# Patient Record
Sex: Female | Born: 1950 | Race: Black or African American | Hispanic: No | Smoking: Never smoker
Health system: Southern US, Community
[De-identification: ages and names within clinical notes are randomized; demographics above are authoritative.]

## PROBLEM LIST (undated history)

## (undated) DIAGNOSIS — M1990 Unspecified osteoarthritis, unspecified site: Secondary | ICD-10-CM

## (undated) DIAGNOSIS — R7303 Prediabetes: Secondary | ICD-10-CM

## (undated) DIAGNOSIS — F32A Depression, unspecified: Secondary | ICD-10-CM

## (undated) DIAGNOSIS — G4730 Sleep apnea, unspecified: Secondary | ICD-10-CM

## (undated) DIAGNOSIS — J45909 Unspecified asthma, uncomplicated: Secondary | ICD-10-CM

## (undated) DIAGNOSIS — F329 Major depressive disorder, single episode, unspecified: Secondary | ICD-10-CM

## (undated) DIAGNOSIS — K219 Gastro-esophageal reflux disease without esophagitis: Secondary | ICD-10-CM

## (undated) DIAGNOSIS — J189 Pneumonia, unspecified organism: Secondary | ICD-10-CM

## (undated) DIAGNOSIS — I10 Essential (primary) hypertension: Secondary | ICD-10-CM

## (undated) DIAGNOSIS — Z87442 Personal history of urinary calculi: Secondary | ICD-10-CM

## (undated) DIAGNOSIS — I499 Cardiac arrhythmia, unspecified: Secondary | ICD-10-CM

## (undated) HISTORY — PX: ROTATOR CUFF REPAIR: SHX139

## (undated) HISTORY — PX: BACK SURGERY: SHX140

## (undated) HISTORY — PX: ABDOMINAL HYSTERECTOMY: SHX81

---

## 2014-06-26 ENCOUNTER — Ambulatory Visit: Payer: Self-pay

## 2016-06-15 ENCOUNTER — Emergency Department: Payer: Federal, State, Local not specified - PPO

## 2016-06-15 ENCOUNTER — Emergency Department
Admission: EM | Admit: 2016-06-15 | Discharge: 2016-06-15 | Disposition: A | Discharge status: Home / Self Care | Payer: Federal, State, Local not specified - PPO | Attending: Emergency Medicine | Admitting: Emergency Medicine

## 2016-06-15 ENCOUNTER — Encounter: Payer: Self-pay | Admitting: *Deleted

## 2016-06-15 DIAGNOSIS — Z91040 Latex allergy status: Secondary | ICD-10-CM | POA: Insufficient documentation

## 2016-06-15 DIAGNOSIS — M5442 Lumbago with sciatica, left side: Secondary | ICD-10-CM | POA: Diagnosis not present

## 2016-06-15 DIAGNOSIS — M545 Low back pain: Secondary | ICD-10-CM | POA: Diagnosis present

## 2016-06-15 DIAGNOSIS — M549 Dorsalgia, unspecified: Secondary | ICD-10-CM

## 2016-06-15 DIAGNOSIS — M5432 Sciatica, left side: Secondary | ICD-10-CM

## 2016-06-15 MED ORDER — KETOROLAC TROMETHAMINE 60 MG/2ML IM SOLN
60.0000 mg | Freq: Once | INTRAMUSCULAR | Status: AC
  Administered 2016-06-15: 60 mg via INTRAMUSCULAR
  Filled 2016-06-15: qty 2

## 2016-06-15 MED ORDER — OXYCODONE-ACETAMINOPHEN 5-325 MG PO TABS
1.0000 | ORAL_TABLET | Freq: Once | ORAL | Status: AC
  Administered 2016-06-15: 1 via ORAL
  Filled 2016-06-15: qty 1

## 2016-06-15 MED ORDER — CYCLOBENZAPRINE HCL 10 MG PO TABS
10.0000 mg | ORAL_TABLET | Freq: Once | ORAL | Status: AC
  Administered 2016-06-15: 10 mg via ORAL
  Filled 2016-06-15: qty 1

## 2016-06-15 MED ORDER — PREDNISONE 10 MG PO TABS
10.0000 mg | ORAL_TABLET | Freq: Two times a day (BID) | ORAL | 0 refills | Status: DC
Start: 2016-06-15 — End: 2019-04-13

## 2016-06-15 MED ORDER — HYDROCODONE-ACETAMINOPHEN 5-325 MG PO TABS: 1.0000 | ORAL_TABLET | Freq: Three times a day (TID) | ORAL | 0 refills | Status: DC | PRN

## 2016-06-15 MED ORDER — CYCLOBENZAPRINE HCL 5 MG PO TABS: 5.0000 mg | ORAL_TABLET | Freq: Three times a day (TID) | ORAL | 0 refills | Status: DC | PRN

## 2016-06-15 NOTE — Discharge Instructions (Signed)
Take the prescription meds as directed. Follow-up with your provider for continued symptoms. Return to the ED for symptoms worsening as discussed: leg weakness, numbness, or complete loss of bladder/bowel control.

## 2016-06-15 NOTE — ED Provider Notes (Signed)
[facility] Emergency Department Provider Note ____________________________________________  Time seen: 1559  I have reviewed the triage vital signs and the nursing notes.  HISTORY  Chief Complaint  Back Pain  HPI Andrea Glass is a 65 y.o. female presents to the ED for evaluation of low back pain with left lotion a referral that has been aching for the last week, but increased in intensity over the last 3 days. The patient gives a history of a 3-level lumbar laminectomy in 2013.She denies any interim back pain ongoing symptoms following her surgery. She denies any recent injury, accident, trauma, or fall. She also denies any bladder or bowel incontinence. She does admit to a pain in the posterior left leg that radiates from the buttocks down to the foot. He has not sought care with her primary care provider since the onset of symptoms. She's been taking Tylenol about 3 times a day with limited benefit. She is in the preplanning stages of a total knee replacement on the right, and notes that her increased back pain is causing some increased difficulty with her right knee.  History reviewed. No pertinent past medical history.  There are no active problems to display for this patient.  History reviewed. No pertinent surgical history.  Prior to Admission medications   Medication Sig Start Date End Date Taking? Authorizing Provider  cyclobenzaprine (FLEXERIL) 5 MG tablet Take 1 tablet (5 mg total) by mouth 3 (three) times daily as needed for muscle spasms. 06/15/16   Khy Pitre V Bacon Dillard Pascal, PA-C  HYDROcodone-acetaminophen (NORCO) 5-325 MG tablet Take 1 tablet by mouth every 8 (eight) hours as needed. 06/15/16   Rayn Enderson V Bacon Tayshaun Kroh, PA-C  predniSONE (DELTASONE) 10 MG tablet Take 1 tablet (10 mg total) by mouth 2 (two) times daily with a meal. 06/15/16   Sheniece Ruggles V Bacon Stace Peace, PA-C   Allergies Latex  No family history on file.  Social History Social History   Substance Use Topics  . Smoking status: Never Smoker  . Smokeless tobacco: Never Used  . Alcohol use No   Review of Systems  Constitutional: Negative for fever. Cardiovascular: Negative for chest pain. Respiratory: Negative for shortness of breath. Gastrointestinal: Negative for abdominal pain, vomiting and diarrhea. Genitourinary: Negative for dysuria. Musculoskeletal: Positive for back pain. Skin: Negative for rash. Neurological: Negative for headaches, focal weakness or numbness. ____________________________________________  PHYSICAL EXAM:  VITAL SIGNS: ED Triage Vitals  Enc Vitals Group     BP 06/15/16 1524 120/65     Pulse Rate 06/15/16 1524 70     Resp 06/15/16 1524 20     Temp 06/15/16 1524 97.9 F (36.6 C)     Temp src --      SpO2 06/15/16 1524 98 %     Weight 06/15/16 1525 290 lb (131.5 kg)     Height 06/15/16 1525 5\' 5"  (1.651 m)     Head Circumference --      Peak Flow --      Pain Score 06/15/16 1526 10     Pain Loc --      Pain Edu? --      Excl. in GC? --    Constitutional: Alert and oriented. Well appearing and in no distress. Head: Normocephalic and atraumatic. Cardiovascular: Normal rate, regular rhythm.  Respiratory: Normal respiratory effort. No wheezes/rales/rhonchi. Gastrointestinal: Soft and nontender. No distention. Musculoskeletal: Normal spinal alignment without midline tenderness, spasm, deformity, step-off. Patient with normal transition from supine to sit. She had a negative  seated straight leg raise bilaterally. The pain is localized to the posterior and lateral left buttocks and thigh, primarily. Nontender with normal range of motion in all extremities.  Neurologic:  Cranial nerves II through XII grossly intact. Normal LE DTRs bilaterally. Normal gait without ataxia. Normal speech and language. No gross focal neurologic deficits are appreciated. Skin:  Skin is warm, dry and intact. No rash  noted. ____________________________________________   RADIOLOGY  LS Spine FINDINGS: No fracture. No subluxation. Loss of disc height is seen at L3-4 L4-5. The patient is status post L3-5 laminectomy. Facets are well aligned bilaterally. SI joints are normal.  IMPRESSION: Stable exam.  No acute findings.  I, [name], Charlesetta Ivory, personally viewed and evaluated these images (plain radiographs) as part of my medical decision making, as well as reviewing the written report by the radiologist. ____________________________________________  PROCEDURES  Toradol 60 mg IM Flexeril 10 mg PO Percocet 5-325 mg PO ____________________________________________  INITIAL IMPRESSION / ASSESSMENT AND PLAN / ED COURSE  Patient with low back pain with left low extremity radicular symptoms that appear to be consistent with an sciatica. Her x-ray is stable and essentially unchanged from 2015. She is advised this time to follow-up with her primary care provider and/or her spine surgeon for further evaluation management. She will be discharged with prescription for Flexeril, Vicodin, and prednisone. Return precautions are reviewed.  Clinical Course   ____________________________________________  FINAL CLINICAL IMPRESSION(S) / ED DIAGNOSES  Final diagnoses:  Sciatica of left side  Left-sided low back pain with left-sided sciatica  Acute back pain      [name], PA-C 06/15/16 2339    [name], MD 06/15/16 2344

## 2016-06-15 NOTE — ED Triage Notes (Signed)
Pt to triage via wheelchair.  Pt has low back pain for 3 days.  No known injury.  Pt states pain radiates into left leg.  Denies urinary sx

## 2016-07-19 ENCOUNTER — Other Ambulatory Visit: Payer: Self-pay | Admitting: Physician Assistant

## 2016-07-19 NOTE — Progress Notes (Signed)
Pt is being scheduled for preop appt; please place surgical orders in epic. Thanks.  

## 2016-07-23 ENCOUNTER — Encounter (HOSPITAL_COMMUNITY): Payer: Self-pay

## 2016-07-23 ENCOUNTER — Encounter (HOSPITAL_COMMUNITY)
Admission: RE | Admit: 2016-07-23 | Discharge: 2016-07-23 | Disposition: A | Discharge status: Home / Self Care | Payer: Federal, State, Local not specified - PPO | Source: Ambulatory Visit | Attending: Orthopaedic Surgery | Admitting: Orthopaedic Surgery

## 2016-07-23 ENCOUNTER — Encounter (INDEPENDENT_AMBULATORY_CARE_PROVIDER_SITE_OTHER): Payer: Self-pay

## 2016-07-23 DIAGNOSIS — M1711 Unilateral primary osteoarthritis, right knee: Secondary | ICD-10-CM | POA: Diagnosis not present

## 2016-07-23 DIAGNOSIS — Z01810 Encounter for preprocedural cardiovascular examination: Secondary | ICD-10-CM | POA: Diagnosis present

## 2016-07-23 DIAGNOSIS — Z01812 Encounter for preprocedural laboratory examination: Secondary | ICD-10-CM | POA: Insufficient documentation

## 2016-07-23 HISTORY — DX: Prediabetes: R73.03

## 2016-07-23 HISTORY — DX: Unspecified asthma, uncomplicated: J45.909

## 2016-07-23 HISTORY — DX: Major depressive disorder, single episode, unspecified: F32.9

## 2016-07-23 HISTORY — DX: Depression, unspecified: F32.A

## 2016-07-23 HISTORY — DX: Gastro-esophageal reflux disease without esophagitis: K21.9

## 2016-07-23 HISTORY — DX: Personal history of urinary calculi: Z87.442

## 2016-07-23 HISTORY — DX: Essential (primary) hypertension: I10

## 2016-07-23 HISTORY — DX: Cardiac arrhythmia, unspecified: I49.9

## 2016-07-23 HISTORY — DX: Unspecified osteoarthritis, unspecified site: M19.90

## 2016-07-23 HISTORY — DX: Pneumonia, unspecified organism: J18.9

## 2016-07-23 HISTORY — DX: Sleep apnea, unspecified: G47.30

## 2016-07-23 LAB — SURGICAL PCR SCREEN
MRSA, PCR: NEGATIVE
Staphylococcus aureus: NEGATIVE

## 2016-07-23 NOTE — Patient Instructions (Signed)
Andrea Glass  07/23/2016   Your procedure is scheduled on: Friday 07/30/2016  Report to [facility]  Entrance take [place]  elevators to 3rd floor to  Short Stay Center at   1130  AM.  Call this number if you have problems the morning of surgery [phone]   Remember: ONLY 1 PERSON MAY GO WITH YOU TO SHORT STAY TO GET  READY MORNING OF YOUR SURGERY.   Do not eat food :After Midnight.  May have clear liquids from midnight up until 0730 am then nothing until after surgery!     CLEAR LIQUID DIET   Foods Allowed                                                                     Foods Excluded  Coffee and tea, regular and decaf                             liquids that you cannot  Plain Jell-O in any flavor                                             see through such as: Fruit ices (not with fruit pulp)                                     milk, soups, orange juice  Iced Popsicles                                    All solid food Carbonated beverages, regular and diet                                    Cranberry, grape and apple juices Sports drinks like Gatorade Lightly seasoned clear broth or consume(fat free) Sugar, honey syrup  Sample Menu Breakfast                                Lunch                                     Supper Cranberry juice                    Beef broth                            Chicken broth Jell-O                                     Grape juice  Apple juice Coffee or tea                        Jell-O                                      Popsicle                                                Coffee or tea                        Coffee or tea  _____________________________________________________________________     Take these medicines the morning of surgery with A SIP OF WATER: Zantac, use Ventolin inhaler if needed                                You may not have any metal on your body including  hair pins and              piercings  Do not wear jewelry, make-up, lotions, powders or perfumes, deodorant             Do not wear nail polish.  Do not shave  48 hours prior to surgery.              Men may shave face and neck.   Do not bring valuables to the hospital. [facility] IS NOT             RESPONSIBLE   FOR VALUABLES.  Contacts, dentures or bridgework may not be worn into surgery.  Leave suitcase in the car. After surgery it may be brought to your room.                  Please read over the following fact sheets you were given: _____________________________________________________________________             [facility] - Preparing for Surgery Before surgery, you can play an important role.  Because skin is not sterile, your skin needs to be as free of germs as possible.  You can reduce the number of germs on your skin by washing with CHG (chlorahexidine gluconate) soap before surgery.  CHG is an antiseptic cleaner which kills germs and bonds with the skin to continue killing germs even after washing. Please DO NOT use if you have an allergy to CHG or antibacterial soaps.  If your skin becomes reddened/irritated stop using the CHG and inform your nurse when you arrive at Short Stay. Do not shave (including legs and underarms) for at least 48 hours prior to the first CHG shower.  You may shave your face/neck. Please follow these instructions carefully:  1.  Shower with CHG Soap the night before surgery and the  morning of Surgery.  2.  If you choose to wash your hair, wash your hair first as usual with your  normal  shampoo.  3.  After you shampoo, rinse your hair and body thoroughly to remove the  shampoo.                           4.  Use CHG  as you would any other liquid soap.  You can apply chg directly  to the skin and wash                       Gently with a scrungie or clean washcloth.  5.  Apply the CHG Soap to your body ONLY FROM THE NECK DOWN.   Do not use on face/ open                            Wound or open sores. Avoid contact with eyes, ears mouth and genitals (private parts).                       Wash face,  Genitals (private parts) with your normal soap.             6.  Wash thoroughly, paying special attention to the area where your surgery  will be performed.  7.  Thoroughly rinse your body with warm water from the neck down.  8.  DO NOT shower/wash with your normal soap after using and rinsing off  the CHG Soap.                9.  Pat yourself dry with a clean towel.            10.  Wear clean pajamas.            11.  Place clean sheets on your bed the night of your first shower and do not  sleep with pets. Day of Surgery : Do not apply any lotions/deodorants the morning of surgery.  Please wear clean clothes to the hospital/surgery center.  FAILURE TO FOLLOW THESE INSTRUCTIONS MAY RESULT IN THE CANCELLATION OF YOUR SURGERY PATIENT SIGNATURE_________________________________  NURSE SIGNATURE__________________________________  ________________________________________________________________________   Andrea Glass  An incentive spirometer is a tool that can help keep your lungs clear and active. This tool measures how well you are filling your lungs with each breath. Taking long deep breaths may help reverse or decrease the chance of developing breathing (pulmonary) problems (especially infection) following:  A long period of time when you are unable to move or be active. BEFORE THE PROCEDURE   If the spirometer includes an indicator to show your best effort, your nurse or respiratory therapist will set it to a desired goal.  If possible, sit up straight or lean slightly forward. Try not to slouch.  Hold the incentive spirometer in an upright position. INSTRUCTIONS FOR USE  1. Sit on the edge of your bed if possible, or sit up as far as you can in bed or on a chair. 2. Hold the incentive spirometer in an upright position. 3. Breathe  out normally. 4. Place the mouthpiece in your mouth and seal your lips tightly around it. 5. Breathe in slowly and as deeply as possible, raising the piston or the ball toward the top of the column. 6. Hold your breath for 3-5 seconds or for as long as possible. Allow the piston or ball to fall to the bottom of the column. 7. Remove the mouthpiece from your mouth and breathe out normally. 8. Rest for a few seconds and repeat Steps 1 through 7 at least 10 times every 1-2 hours when you are awake. Take your time and take a few normal breaths between deep breaths. 9. The spirometer may include an indicator to show your best effort.  Use the indicator as a goal to work toward during each repetition. 10. After each set of 10 deep breaths, practice coughing to be sure your lungs are clear. If you have an incision (the cut made at the time of surgery), support your incision when coughing by placing a pillow or rolled up towels firmly against it. Once you are able to get out of bed, walk around indoors and cough well. You may stop using the incentive spirometer when instructed by your caregiver.  RISKS AND COMPLICATIONS  Take your time so you do not get dizzy or light-headed.  If you are in pain, you may need to take or ask for pain medication before doing incentive spirometry. It is harder to take a deep breath if you are having pain. AFTER USE  Rest and breathe slowly and easily.  It can be helpful to keep track of a log of your progress. Your caregiver can provide you with a simple table to help with this. If you are using the spirometer at home, follow these instructions: SEEK MEDICAL CARE IF:   You are having difficultly using the spirometer.  You have trouble using the spirometer as often as instructed.  Your pain medication is not giving enough relief while using the spirometer.  You develop fever of 100.5 F (38.1 C) or higher. SEEK IMMEDIATE MEDICAL CARE IF:   You cough up bloody  sputum that had not been present before.  You develop fever of 102 F (38.9 C) or greater.  You develop worsening pain at or near the incision site. MAKE SURE YOU:   Understand these instructions.  Will watch your condition.  Will get help right away if you are not doing well or get worse. Document Released: 02/14/2007 Document Revised: 12/27/2011 Document Reviewed: 04/17/2007 [facility] Patient Information 2014 [place].   ________________________________________________________________________

## 2016-07-29 MED ORDER — DEXTROSE 5 % IV SOLN
3.0000 g | INTRAVENOUS | Status: AC
  Administered 2016-07-30: 3 g via INTRAVENOUS
  Filled 2016-07-29: qty 3000

## 2016-07-30 ENCOUNTER — Inpatient Hospital Stay (HOSPITAL_COMMUNITY): Payer: Federal, State, Local not specified - PPO | Admitting: Anesthesiology

## 2016-07-30 ENCOUNTER — Inpatient Hospital Stay (HOSPITAL_COMMUNITY)
Admission: RE | Admit: 2016-07-30 | Discharge: 2016-08-04 | DRG: 470 | Disposition: A | Discharge status: Skilled Nursing Facility | Payer: Federal, State, Local not specified - PPO | Source: Ambulatory Visit | Attending: Orthopaedic Surgery | Admitting: Orthopaedic Surgery

## 2016-07-30 ENCOUNTER — Encounter (HOSPITAL_COMMUNITY): Payer: Self-pay | Admitting: *Deleted

## 2016-07-30 ENCOUNTER — Inpatient Hospital Stay (HOSPITAL_COMMUNITY): Payer: Federal, State, Local not specified - PPO

## 2016-07-30 ENCOUNTER — Encounter (HOSPITAL_COMMUNITY)
Admission: RE | Disposition: A | Discharge status: Skilled Nursing Facility | Payer: Self-pay | Source: Ambulatory Visit | Attending: Orthopaedic Surgery

## 2016-07-30 DIAGNOSIS — Z6841 Body Mass Index (BMI) 40.0 and over, adult: Secondary | ICD-10-CM

## 2016-07-30 DIAGNOSIS — E669 Obesity, unspecified: Secondary | ICD-10-CM | POA: Diagnosis present

## 2016-07-30 DIAGNOSIS — I10 Essential (primary) hypertension: Secondary | ICD-10-CM | POA: Diagnosis present

## 2016-07-30 DIAGNOSIS — Z96651 Presence of right artificial knee joint: Secondary | ICD-10-CM

## 2016-07-30 DIAGNOSIS — K219 Gastro-esophageal reflux disease without esophagitis: Secondary | ICD-10-CM | POA: Diagnosis present

## 2016-07-30 DIAGNOSIS — G4730 Sleep apnea, unspecified: Secondary | ICD-10-CM | POA: Diagnosis present

## 2016-07-30 DIAGNOSIS — J45909 Unspecified asthma, uncomplicated: Secondary | ICD-10-CM | POA: Diagnosis present

## 2016-07-30 DIAGNOSIS — M25561 Pain in right knee: Secondary | ICD-10-CM | POA: Diagnosis present

## 2016-07-30 DIAGNOSIS — M21061 Valgus deformity, not elsewhere classified, right knee: Secondary | ICD-10-CM | POA: Diagnosis present

## 2016-07-30 DIAGNOSIS — M1711 Unilateral primary osteoarthritis, right knee: Secondary | ICD-10-CM | POA: Diagnosis present

## 2016-07-30 DIAGNOSIS — R262 Difficulty in walking, not elsewhere classified: Secondary | ICD-10-CM

## 2016-07-30 HISTORY — PX: TOTAL KNEE ARTHROPLASTY: SHX125

## 2016-07-30 SURGERY — ARTHROPLASTY, KNEE, TOTAL
Anesthesia: Spinal | Site: Knee | Laterality: Right

## 2016-07-30 MED ORDER — SERTRALINE HCL 50 MG PO TABS
50.0000 mg | ORAL_TABLET | Freq: Every day | ORAL | Status: DC
Start: 2016-07-31 — End: 2016-08-04
  Administered 2016-07-31 – 2016-08-04 (×5): 50 mg via ORAL
  Filled 2016-07-30 (×5): qty 1

## 2016-07-30 MED ORDER — ALUM & MAG HYDROXIDE-SIMETH 200-200-20 MG/5ML PO SUSP: 30.0000 mL | ORAL | Status: DC | PRN

## 2016-07-30 MED ORDER — PROPOFOL 10 MG/ML IV BOLUS
INTRAVENOUS | Status: DC | PRN
  Administered 2016-07-30: 200 mg via INTRAVENOUS

## 2016-07-30 MED ORDER — ACETAMINOPHEN 650 MG RE SUPP: 650.0000 mg | Freq: Four times a day (QID) | RECTAL | Status: DC | PRN

## 2016-07-30 MED ORDER — SUGAMMADEX SODIUM 500 MG/5ML IV SOLN
INTRAVENOUS | Status: DC | PRN
  Administered 2016-07-30: 260 mg via INTRAVENOUS

## 2016-07-30 MED ORDER — METOCLOPRAMIDE HCL 5 MG PO TABS: 5.0000 mg | ORAL_TABLET | Freq: Three times a day (TID) | ORAL | Status: DC | PRN

## 2016-07-30 MED ORDER — HYDROCHLOROTHIAZIDE 25 MG PO TABS
25.0000 mg | ORAL_TABLET | Freq: Every day | ORAL | Status: DC
  Administered 2016-07-31 – 2016-08-02 (×3): 25 mg via ORAL
  Filled 2016-07-30 (×5): qty 1

## 2016-07-30 MED ORDER — HYDROMORPHONE HCL 1 MG/ML IJ SOLN
INTRAMUSCULAR | Status: DC | PRN
  Administered 2016-07-30 (×2): 1 mg via INTRAVENOUS

## 2016-07-30 MED ORDER — FENTANYL CITRATE (PF) 100 MCG/2ML IJ SOLN
INTRAMUSCULAR | Status: AC
  Filled 2016-07-30: qty 2

## 2016-07-30 MED ORDER — HYDROMORPHONE HCL 2 MG/ML IJ SOLN
INTRAMUSCULAR | Status: AC
  Filled 2016-07-30: qty 1

## 2016-07-30 MED ORDER — PROPOFOL 10 MG/ML IV BOLUS
INTRAVENOUS | Status: AC
  Filled 2016-07-30: qty 20

## 2016-07-30 MED ORDER — METHOCARBAMOL 1000 MG/10ML IJ SOLN
500.0000 mg | Freq: Four times a day (QID) | INTRAVENOUS | Status: DC | PRN
  Administered 2016-07-30: 500 mg via INTRAVENOUS
  Filled 2016-07-30: qty 5
  Filled 2016-07-30: qty 550

## 2016-07-30 MED ORDER — LACTATED RINGERS IV SOLN
INTRAVENOUS | Status: DC
  Administered 2016-07-30: 17:00:00 via INTRAVENOUS
  Administered 2016-07-30: 1000 mL via INTRAVENOUS

## 2016-07-30 MED ORDER — KETOROLAC TROMETHAMINE 15 MG/ML IJ SOLN
7.5000 mg | Freq: Four times a day (QID) | INTRAMUSCULAR | Status: AC
  Administered 2016-07-30 – 2016-07-31 (×4): 7.5 mg via INTRAVENOUS
  Filled 2016-07-30 (×4): qty 1

## 2016-07-30 MED ORDER — SUGAMMADEX SODIUM 500 MG/5ML IV SOLN
INTRAVENOUS | Status: AC
  Filled 2016-07-30: qty 5

## 2016-07-30 MED ORDER — ALBUTEROL SULFATE HFA 108 (90 BASE) MCG/ACT IN AERS: 1.0000 | INHALATION_SPRAY | Freq: Four times a day (QID) | RESPIRATORY_TRACT | Status: DC | PRN

## 2016-07-30 MED ORDER — ALBUTEROL SULFATE (2.5 MG/3ML) 0.083% IN NEBU
2.5000 mg | INHALATION_SOLUTION | Freq: Four times a day (QID) | RESPIRATORY_TRACT | Status: DC | PRN
  Administered 2016-08-02: 2.5 mg via RESPIRATORY_TRACT
  Filled 2016-07-30: qty 3

## 2016-07-30 MED ORDER — DEXAMETHASONE SODIUM PHOSPHATE 10 MG/ML IJ SOLN
INTRAMUSCULAR | Status: AC
  Filled 2016-07-30: qty 1

## 2016-07-30 MED ORDER — METOCLOPRAMIDE HCL 5 MG/ML IJ SOLN
5.0000 mg | Freq: Three times a day (TID) | INTRAMUSCULAR | Status: DC | PRN
  Administered 2016-07-31 – 2016-08-01 (×2): 10 mg via INTRAVENOUS
  Filled 2016-07-30 (×2): qty 2

## 2016-07-30 MED ORDER — 0.9 % SODIUM CHLORIDE (POUR BTL) OPTIME
TOPICAL | Status: DC | PRN
  Administered 2016-07-30: 1000 mL

## 2016-07-30 MED ORDER — FAMOTIDINE 20 MG PO TABS
20.0000 mg | ORAL_TABLET | Freq: Two times a day (BID) | ORAL | Status: DC
  Administered 2016-07-31 – 2016-08-04 (×9): 20 mg via ORAL
  Filled 2016-07-30 (×10): qty 1

## 2016-07-30 MED ORDER — ONDANSETRON HCL 4 MG PO TABS
4.0000 mg | ORAL_TABLET | Freq: Four times a day (QID) | ORAL | Status: DC | PRN
  Administered 2016-08-01: 4 mg via ORAL
  Filled 2016-07-30: qty 1

## 2016-07-30 MED ORDER — DIPHENHYDRAMINE HCL 12.5 MG/5ML PO ELIX: 12.5000 mg | ORAL_SOLUTION | ORAL | Status: DC | PRN

## 2016-07-30 MED ORDER — SUCCINYLCHOLINE CHLORIDE 200 MG/10ML IV SOSY
PREFILLED_SYRINGE | INTRAVENOUS | Status: DC | PRN
  Administered 2016-07-30: 140 mg via INTRAVENOUS

## 2016-07-30 MED ORDER — PHENOL 1.4 % MT LIQD: 1.0000 | OROMUCOSAL | Status: DC | PRN

## 2016-07-30 MED ORDER — LIDOCAINE 2% (20 MG/ML) 5 ML SYRINGE
INTRAMUSCULAR | Status: DC | PRN
  Administered 2016-07-30: 100 mg via INTRAVENOUS

## 2016-07-30 MED ORDER — MENTHOL 3 MG MT LOZG: 1.0000 | LOZENGE | OROMUCOSAL | Status: DC | PRN

## 2016-07-30 MED ORDER — ROCURONIUM BROMIDE 10 MG/ML (PF) SYRINGE
PREFILLED_SYRINGE | INTRAVENOUS | Status: DC | PRN
  Administered 2016-07-30: 5 mg via INTRAVENOUS
  Administered 2016-07-30: 35 mg via INTRAVENOUS
  Administered 2016-07-30 (×2): 10 mg via INTRAVENOUS

## 2016-07-30 MED ORDER — TRANEXAMIC ACID 1000 MG/10ML IV SOLN
1000.0000 mg | INTRAVENOUS | Status: AC
  Administered 2016-07-30: 1000 mg via INTRAVENOUS
  Filled 2016-07-30: qty 1100

## 2016-07-30 MED ORDER — ROCURONIUM BROMIDE 10 MG/ML (PF) SYRINGE
PREFILLED_SYRINGE | INTRAVENOUS | Status: AC
  Filled 2016-07-30: qty 10

## 2016-07-30 MED ORDER — ONDANSETRON HCL 4 MG/2ML IJ SOLN
4.0000 mg | Freq: Four times a day (QID) | INTRAMUSCULAR | Status: DC | PRN
  Administered 2016-07-31 (×2): 4 mg via INTRAVENOUS
  Filled 2016-07-30 (×2): qty 2

## 2016-07-30 MED ORDER — HYDROMORPHONE HCL 1 MG/ML IJ SOLN
1.0000 mg | INTRAMUSCULAR | Status: DC | PRN
  Administered 2016-07-31 – 2016-08-01 (×7): 1 mg via INTRAVENOUS
  Filled 2016-07-30 (×7): qty 1

## 2016-07-30 MED ORDER — SODIUM CHLORIDE 0.9 % IR SOLN
Status: DC | PRN
  Administered 2016-07-30: 2000 mL

## 2016-07-30 MED ORDER — ACETAMINOPHEN 325 MG PO TABS
650.0000 mg | ORAL_TABLET | Freq: Four times a day (QID) | ORAL | Status: DC | PRN
  Administered 2016-07-31 – 2016-08-04 (×7): 650 mg via ORAL
  Filled 2016-07-30 (×7): qty 2

## 2016-07-30 MED ORDER — CEFAZOLIN SODIUM-DEXTROSE 2-4 GM/100ML-% IV SOLN
2.0000 g | Freq: Four times a day (QID) | INTRAVENOUS | Status: AC
  Administered 2016-07-30 – 2016-07-31 (×2): 2 g via INTRAVENOUS
  Filled 2016-07-30 (×2): qty 100

## 2016-07-30 MED ORDER — METHOCARBAMOL 500 MG PO TABS
500.0000 mg | ORAL_TABLET | Freq: Four times a day (QID) | ORAL | Status: DC | PRN
  Administered 2016-07-31 – 2016-08-03 (×7): 500 mg via ORAL
  Filled 2016-07-30 (×7): qty 1

## 2016-07-30 MED ORDER — DOCUSATE SODIUM 100 MG PO CAPS
100.0000 mg | ORAL_CAPSULE | Freq: Two times a day (BID) | ORAL | Status: DC
  Administered 2016-07-31 – 2016-08-04 (×7): 100 mg via ORAL
  Filled 2016-07-30 (×9): qty 1

## 2016-07-30 MED ORDER — CHLORHEXIDINE GLUCONATE 4 % EX LIQD: 60.0000 mL | Freq: Once | CUTANEOUS | Status: DC

## 2016-07-30 MED ORDER — SODIUM CHLORIDE 0.9 % IV SOLN
INTRAVENOUS | Status: DC
  Administered 2016-07-30 – 2016-07-31 (×2): via INTRAVENOUS

## 2016-07-30 MED ORDER — SUCCINYLCHOLINE CHLORIDE 20 MG/ML IJ SOLN
INTRAMUSCULAR | Status: AC
  Filled 2016-07-30: qty 1

## 2016-07-30 MED ORDER — RIVAROXABAN 10 MG PO TABS
10.0000 mg | ORAL_TABLET | Freq: Every day | ORAL | Status: DC
  Administered 2016-07-31 – 2016-08-04 (×5): 10 mg via ORAL
  Filled 2016-07-30 (×5): qty 1

## 2016-07-30 MED ORDER — HYDROMORPHONE HCL 1 MG/ML IJ SOLN
INTRAMUSCULAR | Status: AC
  Filled 2016-07-30: qty 1

## 2016-07-30 MED ORDER — MIDAZOLAM HCL 2 MG/2ML IJ SOLN
INTRAMUSCULAR | Status: AC
  Filled 2016-07-30: qty 2

## 2016-07-30 MED ORDER — ONDANSETRON HCL 4 MG/2ML IJ SOLN
INTRAMUSCULAR | Status: AC
  Filled 2016-07-30: qty 2

## 2016-07-30 MED ORDER — OXYCODONE HCL 5 MG PO TABS
5.0000 mg | ORAL_TABLET | ORAL | Status: DC | PRN
  Administered 2016-07-30 – 2016-07-31 (×3): 5 mg via ORAL
  Administered 2016-07-31: 10 mg via ORAL
  Administered 2016-07-31: 5 mg via ORAL
  Administered 2016-07-31 – 2016-08-04 (×18): 10 mg via ORAL
  Filled 2016-07-30 (×7): qty 2
  Filled 2016-07-30: qty 1
  Filled 2016-07-30 (×2): qty 2
  Filled 2016-07-30 (×2): qty 1
  Filled 2016-07-30 (×11): qty 2
  Filled 2016-07-30: qty 1

## 2016-07-30 MED ORDER — HYDRALAZINE HCL 20 MG/ML IJ SOLN
INTRAMUSCULAR | Status: AC
  Filled 2016-07-30: qty 1

## 2016-07-30 MED ORDER — LABETALOL HCL 5 MG/ML IV SOLN
INTRAVENOUS | Status: AC
  Filled 2016-07-30: qty 4

## 2016-07-30 MED ORDER — HYDROMORPHONE HCL 1 MG/ML IJ SOLN
0.2500 mg | INTRAMUSCULAR | Status: DC | PRN
  Administered 2016-07-30 (×2): 0.5 mg via INTRAVENOUS

## 2016-07-30 MED ORDER — ONDANSETRON HCL 4 MG/2ML IJ SOLN
INTRAMUSCULAR | Status: DC | PRN
  Administered 2016-07-30: 4 mg via INTRAVENOUS

## 2016-07-30 SURGICAL SUPPLY — 51 items
BAG ZIPLOCK 12X15 (MISCELLANEOUS) IMPLANT
BANDAGE ACE 6X5 VEL STRL LF (GAUZE/BANDAGES/DRESSINGS) ×3 IMPLANT
BENZOIN TINCTURE PRP APPL 2/3 (GAUZE/BANDAGES/DRESSINGS) ×3 IMPLANT
BLADE SAG 13.0X1.37X90 (BLADE) IMPLANT
BLADE SAG 18X100X1.27 (BLADE) ×3 IMPLANT
BOWL SMART MIX CTS (DISPOSABLE) ×3 IMPLANT
CAPT KNEE TOTAL 3 ×3 IMPLANT
CEMENT BONE SIMPLEX SPEEDSET (Cement) ×3 IMPLANT
CLOSURE WOUND 1/2 X4 (GAUZE/BANDAGES/DRESSINGS) ×2
CLOTH BEACON ORANGE TIMEOUT ST (SAFETY) ×3 IMPLANT
CUFF TOURN SGL QUICK 34 (TOURNIQUET CUFF) ×2
CUFF TRNQT CYL 34X4X40X1 (TOURNIQUET CUFF) ×1 IMPLANT
DRAPE U-SHAPE 47X51 STRL (DRAPES) ×3 IMPLANT
DRSG AQUACEL AG ADV 3.5X10 (GAUZE/BANDAGES/DRESSINGS) ×3 IMPLANT
DRSG PAD ABDOMINAL 8X10 ST (GAUZE/BANDAGES/DRESSINGS) ×3 IMPLANT
DURAPREP 26ML APPLICATOR (WOUND CARE) ×3 IMPLANT
ELECT REM PT RETURN 9FT ADLT (ELECTROSURGICAL) ×3
ELECTRODE REM PT RTRN 9FT ADLT (ELECTROSURGICAL) ×1 IMPLANT
GAUZE SPONGE 4X4 12PLY STRL (GAUZE/BANDAGES/DRESSINGS) ×3 IMPLANT
GAUZE XEROFORM 1X8 LF (GAUZE/BANDAGES/DRESSINGS) IMPLANT
GLOVE BIO SURGEON STRL SZ7.5 (GLOVE) IMPLANT
GLOVE BIOGEL PI IND STRL 8 (GLOVE) ×1 IMPLANT
GLOVE BIOGEL PI INDICATOR 8 (GLOVE) ×2
GLOVE ECLIPSE 8.0 STRL XLNG CF (GLOVE) IMPLANT
GOWN STRL REUS W/TWL XL LVL3 (GOWN DISPOSABLE) ×6 IMPLANT
HANDPIECE INTERPULSE COAX TIP (DISPOSABLE) ×2
IMMOBILIZER KNEE 20 (SOFTGOODS) ×3
IMMOBILIZER KNEE 20 THIGH 36 (SOFTGOODS) ×1 IMPLANT
IMMOBILIZER KNEE 22 (SOFTGOODS) ×3 IMPLANT
NS IRRIG 1000ML POUR BTL (IV SOLUTION) ×3 IMPLANT
PACK TOTAL KNEE CUSTOM (KITS) ×3 IMPLANT
PADDING CAST COTTON 6X4 STRL (CAST SUPPLIES) ×6 IMPLANT
POSITIONER SURGICAL ARM (MISCELLANEOUS) ×3 IMPLANT
SET HNDPC FAN SPRY TIP SCT (DISPOSABLE) ×1 IMPLANT
SET PAD KNEE POSITIONER (MISCELLANEOUS) ×3 IMPLANT
STAPLER VISISTAT 35W (STAPLE) IMPLANT
STRIP CLOSURE SKIN 1/2X4 (GAUZE/BANDAGES/DRESSINGS) ×4 IMPLANT
SUCTION FRAZIER HANDLE 12FR (TUBING) ×2
SUCTION TUBE FRAZIER 12FR DISP (TUBING) ×1 IMPLANT
SUT MNCRL AB 4-0 PS2 18 (SUTURE) ×3 IMPLANT
SUT VIC AB 0 CT1 27 (SUTURE) ×2
SUT VIC AB 0 CT1 27XBRD ANTBC (SUTURE) ×1 IMPLANT
SUT VIC AB 1 CT1 27 (SUTURE) ×4
SUT VIC AB 1 CT1 27XBRD ANTBC (SUTURE) ×2 IMPLANT
SUT VIC AB 2-0 CT1 27 (SUTURE) ×4
SUT VIC AB 2-0 CT1 TAPERPNT 27 (SUTURE) ×2 IMPLANT
TRAY FOLEY BAG SILVER LF 16FR (SET/KITS/TRAYS/PACK) ×3 IMPLANT
TRAY FOLEY W/METER SILVER 16FR (SET/KITS/TRAYS/PACK) IMPLANT
WATER STERILE IRR 1500ML POUR (IV SOLUTION) ×3 IMPLANT
WRAP KNEE MAXI GEL POST OP (GAUZE/BANDAGES/DRESSINGS) ×3 IMPLANT
YANKAUER SUCT BULB TIP 10FT TU (MISCELLANEOUS) ×3 IMPLANT

## 2016-07-30 NOTE — H&P (Signed)
TOTAL KNEE ADMISSION H&P  Patient is being admitted for right total knee arthroplasty.  Subjective:  Chief Complaint:right knee pain.  HPI: Andrea Glass, 65 y.o. female, has a history of pain and functional disability in the right knee due to arthritis and has failed non-surgical conservative treatments for greater than 12 weeks to includeNSAID's and/or analgesics, corticosteriod injections, flexibility and strengthening excercises, use of assistive devices, weight reduction as appropriate and activity modification.  Onset of symptoms was gradual, starting 2 years ago with gradually worsening course since that time. The patient noted prior procedures on the knee to include  arthroscopy on the right knee(s).  Patient currently rates pain in the right knee(s) at 10 out of 10 with activity. Patient has night pain, worsening of pain with activity and weight bearing, pain that interferes with activities of daily living, pain with passive range of motion, crepitus and joint swelling.  Patient has evidence of subchondral sclerosis, periarticular osteophytes and joint space narrowing by imaging studies. There is no active infection.  Patient Active Problem List   Diagnosis Date Noted  . Osteoarthritis of right knee 07/30/2016   Past Medical History:  Diagnosis Date  . Arthritis   . Asthma   . Depression    when mother passed away  . Dysrhythmia   . GERD (gastroesophageal reflux disease)   . History of kidney stones   . Hypertension   . Pneumonia   . Pre-diabetes   . Sleep apnea    does not use CPAP    Past Surgical History:  Procedure Laterality Date  . ABDOMINAL HYSTERECTOMY    . BACK SURGERY     cervical and lumbar  . ROTATOR CUFF REPAIR     right    No prescriptions prior to admission.   Allergies  Allergen Reactions  . Latex Hives, Shortness Of Breath and Rash    Social History  Substance Use Topics  . Smoking status: Never Smoker  . Smokeless tobacco: Never Used  .  Alcohol use No    No family history on file.   Review of Systems  Musculoskeletal: Positive for joint pain.  All other systems reviewed and are negative.   Objective:  Physical Exam  Constitutional: She is oriented to person, place, and time. She appears well-developed and well-nourished.  HENT:  Head: Normocephalic and atraumatic.  Eyes: EOM are normal. Pupils are equal, round, and reactive to light.  Neck: Normal range of motion. Neck supple.  Cardiovascular: Normal rate and regular rhythm.   Respiratory: Effort normal and breath sounds normal.  GI: Soft. Bowel sounds are normal.  Musculoskeletal:       Right knee: She exhibits decreased range of motion, swelling, effusion and abnormal alignment. Tenderness found. Medial joint line and lateral joint line tenderness noted.  Neurological: She is alert and oriented to person, place, and time.  Skin: Skin is warm and dry.  Psychiatric: She has a normal mood and affect.    Vital signs in last 24 hours:    Labs:   Estimated body mass index is 47.26 kg/m as calculated from the following:   Height as of 07/23/16: 5\' 5"  (1.651 m).   Weight as of 07/23/16: 128.8 kg (284 lb).   Imaging Review Plain radiographs demonstrate severe degenerative joint disease of the right knee(s). The overall alignment ismild valgus. The bone quality appears to be good for age and reported activity level.  Assessment/Plan:  End stage arthritis, right knee   The patient history, physical  examination, clinical judgment of the provider and imaging studies are consistent with end stage degenerative joint disease of the right knee(s) and total knee arthroplasty is deemed medically necessary. The treatment options including medical management, injection therapy arthroscopy and arthroplasty were discussed at length. The risks and benefits of total knee arthroplasty were presented and reviewed. The risks due to aseptic loosening, infection, stiffness, patella  tracking problems, thromboembolic complications and other imponderables were discussed. The patient acknowledged the explanation, agreed to proceed with the plan and consent was signed. Patient is being admitted for inpatient treatment for surgery, pain control, PT, OT, prophylactic antibiotics, VTE prophylaxis, progressive ambulation and ADL's and discharge planning. The patient is planning to be discharged home with home health services

## 2016-07-30 NOTE — Anesthesia Preprocedure Evaluation (Addendum)
Anesthesia Evaluation  Patient identified by MRN, date of birth, ID band Patient awake    Reviewed: Allergy & Precautions, NPO status   Airway Mallampati: II  TM Distance: >3 FB     Dental   Pulmonary asthma , sleep apnea , pneumonia,    breath sounds clear to auscultation       Cardiovascular hypertension, + dysrhythmias  Rhythm:Regular Rate:Normal     Neuro/Psych    GI/Hepatic Neg liver ROS, GERD  ,  Endo/Other  negative endocrine ROS  Renal/GU negative Renal ROS     Musculoskeletal   Abdominal   Peds  Hematology   Anesthesia Other Findings   Reproductive/Obstetrics                            Anesthesia Physical Anesthesia Plan  ASA: III  Anesthesia Plan: Spinal   Post-op Pain Management:    Induction: Intravenous  Airway Management Planned: Simple Face Mask  Additional Equipment:   Intra-op Plan:   Post-operative Plan:   Informed Consent:   Dental advisory given  Plan Discussed with: Anesthesiologist and CRNA  Anesthesia Plan Comments:       Anesthesia Quick Evaluation

## 2016-07-30 NOTE — Anesthesia Procedure Notes (Signed)
Procedure Name: Intubation Date/Time: 07/30/2016 3:19 PM Performed by: Enriqueta ShutterWILLIFORD, Wasim Hurlbut D Pre-anesthesia Checklist: Patient identified, Emergency Drugs available, Suction available and Patient being monitored Patient Re-evaluated:Patient Re-evaluated prior to inductionOxygen Delivery Method: Circle system utilized Preoxygenation: Pre-oxygenation with 100% oxygen Intubation Type: IV induction Ventilation: Mask ventilation without difficulty Laryngoscope Size: Mac and 3 Tube type: Oral Tube size: 7.5 mm Number of attempts: 1 Airway Equipment and Method: Stylet and Oral airway Placement Confirmation: ETT inserted through vocal cords under direct vision,  positive ETCO2 and breath sounds checked- equal and bilateral Secured at: 21 cm Tube secured with: Tape Dental Injury: Teeth and Oropharynx as per pre-operative assessment

## 2016-07-30 NOTE — Transfer of Care (Signed)
Immediate Anesthesia Transfer of Care Note  Patient: Andrea Glass  Procedure(s) Performed: Procedure(s): RIGHT TOTAL KNEE ARTHROPLASTY (Right)  Patient Location: PACU  Anesthesia Type:General  Level of Consciousness:  sedated, patient cooperative and responds to stimulation  Airway & Oxygen Therapy:Patient Spontanous Breathing and Patient connected to face mask oxgen  Post-op Assessment:  Report given to PACU RN and Post -op Vital signs reviewed and stable  Post vital signs:  Reviewed and stable  Last Vitals:  Vitals:   07/30/16 1219 07/30/16 1718  BP: 126/70 (!) 153/79  Pulse: 66 71  Resp: 18 (!) 8  Temp: 36.6 C     Complications: No apparent anesthesia complications

## 2016-07-30 NOTE — Brief Op Note (Signed)
07/30/2016  5:09 PM  PATIENT:  Andrea Glass  65 y.o. female  PRE-OPERATIVE DIAGNOSIS:  Osteoarthritis right knee  POST-OPERATIVE DIAGNOSIS:  Osteoarthritis right knee  PROCEDURE:  Procedure(s): RIGHT TOTAL KNEE ARTHROPLASTY (Right)  SURGEON:  Surgeon(s) and Role:    * Kathryne Hitchhristopher Y Rickard Kennerly, MD - Primary    * Naiping Donnelly StagerM Xu, MD - Assisting  ANESTHESIA:   general  EBL:  Total I/O In: 1000 [I.V.:1000] Out: 400 [Urine:300; Blood:100]  COUNTS:  YES  TOURNIQUET:   Total Tourniquet Time Documented: Thigh (Right) - 54 minutes Total: Thigh (Right) - 54 minutes   DICTATION: .Other Dictation: Dictation Number [id number]  PLAN OF CARE: Admit to inpatient   PATIENT DISPOSITION:  PACU - hemodynamically stable.   Delay start of Pharmacological VTE agent (>24hrs) due to surgical blood loss or risk of bleeding: no

## 2016-07-30 NOTE — Progress Notes (Signed)
Called Dr Randa EvensEdwards of anesthesia: Patient got off elevator to Short Stay drinking sprite. Surgery is scheduled for 1410 today. Dr Randa EvensEdwards states she will review patient's chart to assess any other comorbidities that would postpone surgery today.

## 2016-07-31 LAB — CBC
HEMATOCRIT: 34 % — AB (ref 36.0–46.0)
HEMOGLOBIN: 10.8 g/dL — AB (ref 12.0–15.0)
MCH: 23.7 pg — AB (ref 26.0–34.0)
MCHC: 31.8 g/dL (ref 30.0–36.0)
MCV: 74.7 fL — AB (ref 78.0–100.0)
Platelets: 211 10*3/uL (ref 150–400)
RBC: 4.55 MIL/uL (ref 3.87–5.11)
RDW: 16.3 % — ABNORMAL HIGH (ref 11.5–15.5)
WBC: 10.6 10*3/uL — ABNORMAL HIGH (ref 4.0–10.5)

## 2016-07-31 LAB — BASIC METABOLIC PANEL
Anion gap: 10 (ref 5–15)
BUN: 18 mg/dL (ref 6–20)
CHLORIDE: 100 mmol/L — AB (ref 101–111)
CO2: 23 mmol/L (ref 22–32)
CREATININE: 1.26 mg/dL — AB (ref 0.44–1.00)
Calcium: 9.1 mg/dL (ref 8.9–10.3)
GFR calc non Af Amer: 45 mL/min — ABNORMAL LOW (ref >60)
GFR, EST AFRICAN AMERICAN: 52 mL/min — AB (ref >60)
Glucose, Bld: 171 mg/dL — ABNORMAL HIGH (ref 65–99)
Potassium: 3.7 mmol/L (ref 3.5–5.1)
Sodium: 133 mmol/L — ABNORMAL LOW (ref 135–145)

## 2016-07-31 NOTE — Progress Notes (Signed)
PT Cancellation Note  Patient Details Name: Earnestine Mealingrene S Wenk MRN: [id number] DOB: [birth year]   Cancelled Treatment:    Reason Eval/Treat Not Completed: Pt reports vomiting this afternoon.  Pt was agreeable to getting into [facility] this afternoon-made ortho tech aware. Will return in am to resume OOB with PT.   Rebeca AlertJannie Larenda Reedy, MPT Pager: [phone]

## 2016-07-31 NOTE — Progress Notes (Signed)
Subjective: 1 Day Post-Op Procedure(s) (LRB): RIGHT TOTAL KNEE ARTHROPLASTY (Right) Patient reports pain as moderate.    Objective: Vital signs in last 24 hours: Temp:  [97.5 F (36.4 C)-98.5 F (36.9 C)] 98.5 F (36.9 C) (10/14 0506) Pulse Rate:  [66-81] 74 (10/14 0506) Resp:  [8-20] 18 (10/14 0506) BP: (118-166)/(61-81) 118/61 (10/14 0506) SpO2:  [96 %-100 %] 99 % (10/14 0506) Weight:  [128.8 kg (284 lb)] 128.8 kg (284 lb) (10/13 1219)  Intake/Output from previous day: 10/13 0701 - 10/14 0700 In: 2648 [P.O.:1248; I.V.:1400] Out: 825 [Urine:725; Blood:100] Intake/Output this shift: No intake/output data recorded.   Recent Labs  07/31/16 0510  HGB 10.8*    Recent Labs  07/31/16 0510  WBC 10.6*  RBC 4.55  HCT 34.0*  PLT 211    Recent Labs  07/31/16 0510  NA 133*  K 3.7  CL 100*  CO2 23  BUN 18  CREATININE 1.26*  GLUCOSE 171*  CALCIUM 9.1   No results for input(s): LABPT, INR in the last 72 hours.  Sensation intact distally Intact pulses distally Dorsiflexion/Plantar flexion intact Incision: dressing C/D/I Compartment soft  Assessment/Plan: 1 Day Post-Op Procedure(s) (LRB): RIGHT TOTAL KNEE ARTHROPLASTY (Right) Up with therapy Discharge to SNF Monday - needs short-term SNF due to minimal support at home  Andrea HitchChristopher Y Glass [place] 07/31/2016, 7:42 AM

## 2016-07-31 NOTE — Op Note (Signed)
[name], Glass NO.:  [id number]  MEDICAL RECORD NO.:  [id number]  LOCATION:  1607                         FACILITY:  [facility]  PHYSICIAN:  Vanita Panda. Magnus Ivan, M.D.DATE OF BIRTH:  1951/08/15  DATE OF PROCEDURE:  07/30/2016 DATE OF DISCHARGE:                              OPERATIVE REPORT   PREOPERATIVE DIAGNOSIS:  Primary osteoarthritis and degenerative joint disease, right knee.  POSTOPERATIVE DIAGNOSIS:  Primary osteoarthritis and degenerative joint disease, right knee.  PROCEDURE PERFORMED:  Right total knee arthroplasty.  IMPLANTS:  Stryker Triathlon knee with size 3 femur, size 4 tibial tray, 11-mm fix-bearing polyethylene insert, size 35 patellar button.  SURGEON:  Vanita Panda. Magnus Ivan, M.D.  ASSISTANT:  Glee Arvin, M.D.  ANTIBIOTICS:  2 g of IV Ancef.  TOURNIQUET TIME:  Under 2 hours.  BLOOD LOSS:  About 100 mL.  COMPLICATIONS:  None.  INDICATIONS:  Andrea Glass is a very pleasant 65 year old female, well known to me.  She has some mild valgus deformity of her right knee and obvious tricompartmental arthritis.  We have seen her for long period of time and tried intra-articular steroid injection, supplement injections, weight loss, activity modification, anti-inflammatories, and arthroscopic intervention of that knee.  Her pain is daily and it is detrimentally affected her activities of daily living, her mobility and her quality of life to the point that she does wish for a total knee arthroplasty.  She understands the risk of acute blood loss anemia, nerve and vessel injury, fracture, infection, and DVT.  She understands our goals are decreased pain, improved mobility, and overall improved quality of life.  PROCEDURE DESCRIPTION:  After informed consent was obtained, appropriate right knee was marked.  She was brought to the operating room, placed supine on the operative table.  She was then sat up and attempted spinal anesthesia  but due to her obesity and previous back surgery there had unsuccessful attempt for spinal anesthesia.  She was then laid back in a supine position.  General anesthesia was then obtained and a Foley catheter was then placed.  A nonsterile tourniquet was placed around her upper right thigh.  Her right leg was then prepped and draped from the thigh down the ankle with DuraPrep and sterile drapes and a sterile stockinette.  Time-out was called.  She was identified as correct patient and correct right knee.  I then used Esmarch to wrap out the leg and tourniquet was inflated to 300 mm of pressure.  I then made a direct midline incision over the patella and carried this proximally and distally.  I dissected down the knee joint and carried out a medial parapatellar arthrotomy.  We found a moderate joint effusion and significant periarticular osteophytes throughout the knee and significant cartilage loss throughout the knee especially in the lateral compartment I performed a lateral release laterally and removed remnants of ACL, PCL, medial and lateral meniscus.  With the knee in a flexed position, we put the extramedullary cutting guide for the tibia based off the tibial spine correcting for neutral slope, and varus and valgus. We did choose this for taking 9 mm off the high side.  We made this cut without difficulty.  We then went to the femur and used the intramedullary guide for the femur and based our distal femoral cut an 8 mm cut at 5 degrees externally rotated.  We made this cut without difficulty and cleaned debris from the knee.  We brought the knee back down in extension with a 9-mm extension block and I felt like we had full extension.  We then went back to the femur and put our femoral sizing guide based off the epicondylar axis.  We then chose a size 3 femur.  We put our 4-in-1 cutting block for a size 3 femur and made our anterior and posterior cuts followed by our chamfer cuts.   We then made our femoral box cut.  Attention was then turned back to the tibia.  We set our tibial rotation off the tibial tubercle and the femur and then chose a size 4 tibia we made our keel punch off this with a 4 trial tibia and the 3 trial femur.  We trialed a 9 mm and 11 mm polyethylene insert and I was pleased with 11 mm insert.  We then cut our patella and drilled 3 holes for placing a size 35 patellar button.  With all trials inserts in place, I was pleased with stability and range of motion.  We then removed all trial instrumentation and irrigated the knee with normal saline solution using pulsatile lavage.  We mixed our cement and cemented the real Stryker Triathlon tibial tray size 4 followed by the real size 3 femur.  We placed the real 11 mm fix-bearing polyethylene insert and cemented the patellar button.  Once the cement had hardened, we removed cement debris from the knee and we let the tourniquet down. Hemostasis was obtained with electrocautery.  We then closed the arthrotomy with interrupted #1 Vicryl suture followed by 0 Vicryl deep tissue, 2-0 Vicryl in the subcutaneous tissue, 4-0 Monocryl subcuticular stitch and Steri-Strips on the skin.  Well-padded sterile dressing was applied.  She was awakened extubated taken to the recovery room in stable condition.  All final counts were correct.  There were no complications noted.     Vanita Pandahristopher Y. Magnus IvanBlackman, M.D.     CYB/MEDQ  D:  07/30/2016  T:  07/31/2016  Job:  [id number]

## 2016-07-31 NOTE — Evaluation (Signed)
Physical Therapy Evaluation Patient Details Name: Andrea Glass MRN: [id number] DOB: 06-04-1951 Today's Date: 07/31/2016   History of Present Illness  65 yo female s/p R TKA 07/30/16. Hx of obesity  Clinical Impression  On eval, pt required Mod assist +2 for mobility. She was able to walk ~7 feet with a RW. Pain rated 9/10 with activity. Session limited by nausea, dizziness, and pain. Will follow and progress activity as tolerated. Recommend ST rehab at SNF.     Follow Up Recommendations SNF    Equipment Recommendations   (TBD at next venue)    Recommendations for Other Services       Precautions / Restrictions Precautions Precautions: Fall;Knee Required Braces or Orthoses: Knee Immobilizer - Right Knee Immobilizer - Right: Discontinue once straight leg raise with < 10 degree lag Restrictions Weight Bearing Restrictions: No RLE Weight Bearing: Weight bearing as tolerated      Mobility  Bed Mobility Overal bed mobility: Needs Assistance Bed Mobility: Supine to Sit     Supine to sit: HOB elevated;Min assist     General bed mobility comments: assist for R LE. Increased time. Reliance on bedrail.   Transfers Overall transfer level: Needs assistance Equipment used: Rolling walker (2 wheeled) Transfers: Sit to/from Stand Sit to Stand: Mod assist;+2 physical assistance;+2 safety/equipment;From elevated surface         General transfer comment: Assist to rise, stabilize, control descent. VCs safety, technqiue, hand/LE placement. Pt unable to stand with 1 hand on bed so allowed pt to put both hands on walker..  Ambulation/Gait Ambulation/Gait assistance: Min assist;+2 safety/equipment;+2 physical assistance Ambulation Distance (Feet): 7 Feet Assistive device: Rolling walker (2 wheeled) Gait Pattern/deviations: Step-to pattern;Antalgic     General Gait Details: Assist to stabilize pt. Followed closely with recliner. Distance limited by dizziness, nausea, pain, and  fatigue.   Stairs            Wheelchair Mobility    Modified Rankin (Stroke Patients Only)       Balance Overall balance assessment: Needs assistance         Standing balance support: Bilateral upper extremity supported Standing balance-Leahy Scale: Poor                               Pertinent Vitals/Pain Pain Assessment: 0-10 Pain Score: 9  Pain Location: R knee Pain Descriptors / Indicators: Sore;Aching Pain Intervention(s): Limited activity within patient's tolerance;Repositioned;Ice applied    Home Living Family/patient expects to be discharged to:: Skilled nursing facility               Home Equipment: Gilmer Mor - single point      Prior Function                 Hand Dominance        Extremity/Trunk Assessment   Upper Extremity Assessment: Defer to OT evaluation           Lower Extremity Assessment: RLE deficits/detail RLE Deficits / Details: hip flex 2/5.     Cervical / Trunk Assessment: Normal  Communication   Communication: No difficulties  Cognition Arousal/Alertness: Awake/alert Behavior During Therapy: WFL for tasks assessed/performed Overall Cognitive Status: Within Functional Limits for tasks assessed                      General Comments      Exercises     Assessment/Plan    PT Assessment Patient  needs continued PT services  PT Problem List Decreased strength;Decreased mobility;Decreased range of motion;Decreased activity tolerance;Decreased balance;Pain;Decreased knowledge of use of DME          PT Treatment Interventions DME instruction;Gait training;Therapeutic activities;Therapeutic exercise;Balance training;Functional mobility training;Patient/family education    PT Goals (Current goals can be found in the Care Plan section)  Acute Rehab PT Goals Patient Stated Goal: less pain.  PT Goal Formulation: With patient Time For Goal Achievement: 08/14/16 Potential to Achieve Goals: Good     Frequency 7X/week   Barriers to discharge        Co-evaluation               End of Session Equipment Utilized During Treatment: Right knee immobilizer Activity Tolerance: Patient limited by fatigue;Patient limited by pain Patient left: in chair;with call bell/phone within reach           Time: [id number] PT Time Calculation (min) (ACUTE ONLY): 17 min   Charges:   PT Evaluation $PT Eval Low Complexity: 1 Procedure     PT G Codes:        Rebeca AlertJannie Azayla Polo, MPT Pager: [phone]

## 2016-08-01 MED ORDER — OXYCODONE-ACETAMINOPHEN 5-325 MG PO TABS: 1.0000 | ORAL_TABLET | ORAL | 0 refills | Status: DC | PRN

## 2016-08-01 MED ORDER — CYCLOBENZAPRINE HCL 5 MG PO TABS: 10.0000 mg | ORAL_TABLET | Freq: Three times a day (TID) | ORAL | 0 refills | Status: DC | PRN

## 2016-08-01 MED ORDER — RIVAROXABAN 10 MG PO TABS: 10.0000 mg | ORAL_TABLET | Freq: Every day | ORAL | 0 refills | Status: AC

## 2016-08-01 NOTE — [place] FL2 (Signed)
[place] MEDICAID FL2 LEVEL OF CARE SCREENING TOOL     IDENTIFICATION  Patient Name: Andrea Glass Birthdate: May 02, 1951 Sex: female Admission Date (Current Location): 07/30/2016  [facility] and [place] Number:  Producer, television/film/video and Address:  [place], [address] [place], [place] [address]      Provider Number: [id number]  Attending Physician Name and Address:  Kathryne Hitch, *  Relative Name and Phone Number:       Current Level of Care: Hospital Recommended Level of Care: Skilled Nursing Facility Prior Approval Number:    Date Approved/Denied:   PASRR Number: [id number]  Discharge Plan: SNF    Current Diagnoses: Patient Active Problem List   Diagnosis Date Noted  . Osteoarthritis of right knee 07/30/2016  . Status post total right knee replacement 07/30/2016    Orientation RESPIRATION BLADDER Height & Weight     Self, Time, Situation, Place  Normal Continent Weight: 284 lb (128.8 kg) (as per PST on 07/23/16) Height:  5\' 5"  (165.1 cm)  BEHAVIORAL SYMPTOMS/MOOD NEUROLOGICAL BOWEL NUTRITION STATUS      Continent Diet (Regular Diet)  AMBULATORY STATUS COMMUNICATION OF NEEDS Skin   Limited Assist Verbally Normal                       Personal Care Assistance Level of Assistance  Bathing, Feeding, Dressing Bathing Assistance: Limited assistance Feeding assistance: Independent Dressing Assistance: Limited assistance     Functional Limitations Info  Sight, Hearing, Speech Sight Info: Adequate Hearing Info: Adequate Speech Info: Adequate    SPECIAL CARE FACTORS FREQUENCY  PT (By licensed PT), OT (By licensed OT)     PT Frequency: 5 OT Frequency: 5            Contractures Contractures Info: Not present    Additional Factors Info  Code Status, Allergies Code Status Info: Full Code Allergies Info: Latex           Current Medications (08/01/2016):  This is the current hospital active medication  list Current Facility-Administered Medications  Medication Dose Route Frequency Provider Last Rate Last Dose  . 0.9 %  sodium chloride infusion   Intravenous Continuous Kathryne Hitch, MD   Stopped at 08/01/16 0700  . acetaminophen (TYLENOL) tablet 650 mg  650 mg Oral Q6H PRN Kathryne Hitch, MD   650 mg at 07/31/16 1826   Or  . acetaminophen (TYLENOL) suppository 650 mg  650 mg Rectal Q6H PRN Kathryne Hitch, MD      . albuterol (PROVENTIL) (2.5 MG/3ML) 0.083% nebulizer solution 2.5 mg  2.5 mg Nebulization Q6H PRN Kathryne Hitch, MD      . alum & mag hydroxide-simeth (MAALOX/MYLANTA) 200-200-20 MG/5ML suspension 30 mL  30 mL Oral Q4H PRN Kathryne Hitch, MD      . diphenhydrAMINE (BENADRYL) 12.5 MG/5ML elixir 12.5-25 mg  12.5-25 mg Oral Q4H PRN Kathryne Hitch, MD      . docusate sodium (COLACE) capsule 100 mg  100 mg Oral BID Kathryne Hitch, MD   100 mg at 08/01/16 1002  . famotidine (PEPCID) tablet 20 mg  20 mg Oral BID Kathryne Hitch, MD   20 mg at 08/01/16 1002  . hydrochlorothiazide (HYDRODIURIL) tablet 25 mg  25 mg Oral Daily Kathryne Hitch, MD   25 mg at 08/01/16 1001  . HYDROmorphone (DILAUDID) injection 1 mg  1 mg Intravenous Q2H PRN Kathryne Hitch, MD  1 mg at 08/01/16 1001  . menthol-cetylpyridinium (CEPACOL) lozenge 3 mg  1 lozenge Oral PRN Kathryne Hitchhristopher Y Blackman, MD       Or  . phenol (CHLORASEPTIC) mouth spray 1 spray  1 spray Mouth/Throat PRN Kathryne Hitchhristopher Y Blackman, MD      . methocarbamol (ROBAXIN) tablet 500 mg  500 mg Oral Q6H PRN Kathryne Hitchhristopher Y Blackman, MD   500 mg at 08/01/16 0740   Or  . methocarbamol (ROBAXIN) 500 mg in dextrose 5 % 50 mL IVPB  500 mg Intravenous Q6H PRN Kathryne Hitchhristopher Y Blackman, MD   500 mg at 07/30/16 1808  . metoCLOPramide (REGLAN) tablet 5-10 mg  5-10 mg Oral Q8H PRN Kathryne Hitchhristopher Y Blackman, MD       Or  . metoCLOPramide (REGLAN) injection 5-10 mg  5-10 mg Intravenous Q8H PRN  Kathryne Hitchhristopher Y Blackman, MD   10 mg at 08/01/16 0740  . ondansetron (ZOFRAN) tablet 4 mg  4 mg Oral Q6H PRN Kathryne Hitchhristopher Y Blackman, MD   4 mg at 08/01/16 1001   Or  . ondansetron [facility](ZOFRAN) injection 4 mg  4 mg Intravenous Q6H PRN Kathryne Hitchhristopher Y Blackman, MD   4 mg at 07/31/16 2309  . oxyCODONE (Oxy IR/ROXICODONE) immediate release tablet 5-10 mg  5-10 mg Oral Q3H PRN Kathryne Hitchhristopher Y Blackman, MD   10 mg at 08/01/16 1311  . rivaroxaban (XARELTO) tablet 10 mg  10 mg Oral Q breakfast Kathryne Hitchhristopher Y Blackman, MD   10 mg at 08/01/16 0741  . sertraline (ZOLOFT) tablet 50 mg  50 mg Oral Daily Kathryne Hitchhristopher Y Blackman, MD   50 mg at 08/01/16 1002     Discharge Medications: Please see discharge summary for a list of discharge medications.  Relevant Imaging Results:  Relevant Lab Results:   Additional Information SSN:  [id number]  Dede QuerySarah Kathalene Sporer, LCSW

## 2016-08-01 NOTE — Progress Notes (Signed)
Physical Therapy Treatment Patient Details Name: Andrea Glass MRN: [id number] DOB: Oct 08, 1951 Today's Date: 08/01/2016    History of Present Illness 65 yo female s/p R TKA 07/30/16. Hx of obesity    PT Comments    Progressing slowly with mobility. Mobility has been limited by nausea, dizziness. Continue to recommend SNF.   Follow Up Recommendations  SNF     Equipment Recommendations       Recommendations for Other Services       Precautions / Restrictions Precautions Precautions: Fall;Knee Required Braces or Orthoses: Knee Immobilizer - Right Knee Immobilizer - Right: Discontinue once straight leg raise with < 10 degree lag Restrictions Weight Bearing Restrictions: No RLE Weight Bearing: Weight bearing as tolerated    Mobility  Bed Mobility Overal bed mobility: Needs Assistance Bed Mobility: Supine to Sit     Supine to sit: Min assist;HOB elevated     General bed mobility comments: assist for R LE. Increased time. Reliance on bedrail.   Transfers Overall transfer level: Needs assistance Equipment used: Rolling walker (2 wheeled) Transfers: Sit to/from Stand Sit to Stand: From elevated surface;Min assist;+2 physical assistance;+2 safety/equipment         General transfer comment: Assist to rise, stabilize, control descent. VCs safety, technqiue, hand/LE placement. Pt unable to stand without putting both hands on walker.  Ambulation/Gait Ambulation/Gait assistance: Min assist;+2 physical assistance;+2 safety/equipment Ambulation Distance (Feet): 8 Feet Assistive device: Rolling walker (2 wheeled) Gait Pattern/deviations: Step-to pattern;Trunk flexed;Antalgic     General Gait Details: Assist to stabilize pt. Followed closely with recliner. Distance limited by dizziness, nausea.    Stairs            Wheelchair Mobility    Modified Rankin (Stroke Patients Only)       Balance                                    Cognition  Arousal/Alertness: Awake/alert Behavior During Therapy: WFL for tasks assessed/performed Overall Cognitive Status: Within Functional Limits for tasks assessed                      Exercises Total Joint Exercises Ankle Circles/Pumps: AROM;Both;10 reps;Supine Quad Sets: AROM;Both;10 reps;Supine Heel Slides: AAROM;Right;10 reps;Supine Hip ABduction/ADduction: AROM;Right;10 reps;Supine Straight Leg Raises: AAROM;Right;10 reps;Supine Goniometric ROM: ~5-50 degrees    General Comments        Pertinent Vitals/Pain Pain Assessment: Faces Faces Pain Scale: Hurts even more Pain Location: R knee Pain Descriptors / Indicators: Aching;Sore Pain Intervention(s): Limited activity within patient's tolerance;Repositioned;Ice applied    Home Living                      Prior Function            PT Goals (current goals can now be found in the care plan section) Progress towards PT goals: Progressing toward goals (slowly)    Frequency    7X/week      PT Plan Current plan remains appropriate    Co-evaluation             End of Session Equipment Utilized During Treatment: Right knee immobilizer Activity Tolerance:  (Limited by nausea, dizziness) Patient left: in chair;with call bell/phone within reach     Time: 0909-0925 PT Time Calculation (min) (ACUTE ONLY): 16 min  Charges:  $Gait Training: 8-22 mins  G Codes:      Weston Anna, MPT Pager: [phone]

## 2016-08-01 NOTE — Care Management Note (Signed)
Case Management Note  Patient Details  Name: Andrea Glass MRN: [id number] Date of Birth: September 12, 1951  Subjective/Objective:     Right TKA               Action/Plan: Discharge Planning: NCM spoke to pt and states she is requesting SNF -[facility]. CSW for referral to SNF. CSW will follow for placement.   Expected Discharge Date:               Expected Discharge Plan:  Skilled Nursing Facility  In-House Referral:  Clinical Social Work  Discharge planning Services  CM Consult  Post Acute Care Choice:  NA Choice offered to:  NA  DME Arranged:  N/A DME Agency:  NA  HH Arranged:  NA HH Agency:  NA  Status of Service:  Completed, signed off  If discussed at Long Length of Stay Meetings, dates discussed:    Additional Comments:  Elliot CousinShavis, Davey Limas Ellen, RN 08/01/2016, 10:51 AM

## 2016-08-01 NOTE — Progress Notes (Signed)
Subjective: 2 Days Post-Op Procedure(s) (LRB): RIGHT TOTAL KNEE ARTHROPLASTY (Right) Patient reports pain as moderate.    Objective: Vital signs in last 24 hours: Temp:  [97.7 F (36.5 C)-98.9 F (37.2 C)] 98.9 F (37.2 C) (10/15 0525) Pulse Rate:  [64-89] 89 (10/15 0525) Resp:  [16-17] 16 (10/15 0525) BP: (112-119)/(50-74) 114/50 (10/15 0525) SpO2:  [93 %-97 %] 96 % (10/15 0525)  Intake/Output from previous day: 10/14 0701 - 10/15 0700 In: 2064.2 [P.O.:1080; I.V.:984.2] Out: 1350 [Urine:1350] Intake/Output this shift: Total I/O In: 360 [P.O.:360] Out: 600 [Urine:600]   Recent Labs  07/31/16 0510  HGB 10.8*    Recent Labs  07/31/16 0510  WBC 10.6*  RBC 4.55  HCT 34.0*  PLT 211    Recent Labs  07/31/16 0510  NA 133*  K 3.7  CL 100*  CO2 23  BUN 18  CREATININE 1.26*  GLUCOSE 171*  CALCIUM 9.1   No results for input(s): LABPT, INR in the last 72 hours.  Sensation intact distally Intact pulses distally Dorsiflexion/Plantar flexion intact Incision: no drainage No cellulitis present Compartment soft  Assessment/Plan: 2 Days Post-Op Procedure(s) (LRB): RIGHT TOTAL KNEE ARTHROPLASTY (Right) Up with therapy Plan for discharge tomorrow Discharge to SNF  Kathryne HitchChristopher Y Blackman 08/01/2016, 11:05 AM

## 2016-08-01 NOTE — Clinical Social Work Placement (Signed)
   CLINICAL SOCIAL WORK PLACEMENT  NOTE  Date:  08/01/2016  Patient Details  Name: Earnestine Mealingrene S Fiorella MRN: [id number] Date of Birth: [birth year]  Clinical Social Work is seeking post-discharge placement for this patient at the Skilled  Nursing Facility level of care (*CSW will initial, date and re-position this form in  chart as items are completed):  Yes   Patient/family provided with [facility] Clinical Social Work Department's list of facilities offering this level of care within the geographic area requested by the patient (or if unable, by the patient's family).  Yes   Patient/family informed of their freedom to choose among providers that offer the needed level of care, that participate in Medicare, Medicaid or managed care program needed by the patient, have an available bed and are willing to accept the patient.  Yes   Patient/family informed of [facility]'s ownership interest in [facility] and [facility], as well as of the fact that they are under no obligation to receive care at these facilities.  PASRR submitted to EDS on 08/01/16     PASRR number received on 08/01/16     Existing PASRR number confirmed on       FL2 transmitted to all facilities in geographic area requested by pt/family on 08/01/16     FL2 transmitted to all facilities within larger geographic area on       Patient informed that his/her managed care company has contracts with or will negotiate with certain facilities, including the following:            Patient/family informed of bed offers received.  Patient chooses bed at       Physician recommends and patient chooses bed at      Patient to be transferred to   on  .  Patient to be transferred to facility by       Patient family notified on   of transfer.  Name of family member notified:        PHYSICIAN       Additional Comment:    _______________________________________________ Dede QuerySarah Roper Tolson, LCSW 08/01/2016, 2:05 PM

## 2016-08-01 NOTE — Discharge Instructions (Signed)
INSTRUCTIONS AFTER JOINT REPLACEMENT  ° °o Remove items at home which could result in a fall. This includes throw rugs or furniture in walking pathways °o ICE to the affected joint every three hours while awake for 30 minutes at a time, for at least the first 3-5 days, and then as needed for pain and swelling.  Continue to use ice for pain and swelling. You may notice swelling that will progress down to the foot and ankle.  This is normal after surgery.  Elevate your leg when you are not up walking on it.   °o Continue to use the breathing machine you got in the hospital (incentive spirometer) which will help keep your temperature down.  It is common for your temperature to cycle up and down following surgery, especially at night when you are not up moving around and exerting yourself.  The breathing machine keeps your lungs expanded and your temperature down. ° ° °DIET:  As you were doing prior to hospitalization, we recommend a well-balanced diet. ° °DRESSING / WOUND CARE / SHOWERING ° °Keep the surgical dressing until follow up.  The dressing is water proof, so you can shower without any extra covering.  IF THE DRESSING FALLS OFF or the wound gets wet inside, change the dressing with sterile gauze.  Please use good hand washing techniques before changing the dressing.  Do not use any lotions or creams on the incision until instructed by your surgeon.   ° °ACTIVITY ° °o Increase activity slowly as tolerated, but follow the weight bearing instructions below.   °o No driving for 6 weeks or until further direction given by your physician.  You cannot drive while taking narcotics.  °o No lifting or carrying greater than 10 lbs. until further directed by your surgeon. °o Avoid periods of inactivity such as sitting longer than an hour when not asleep. This helps prevent blood clots.  °o You may return to work once you are authorized by your doctor.  ° ° ° °WEIGHT BEARING  ° °Weight bearing as tolerated with assist  device (walker, cane, etc) as directed, use it as long as suggested by your surgeon or therapist, typically at least 4-6 weeks. ° ° °EXERCISES ° °Results after joint replacement surgery are often greatly improved when you follow the exercise, range of motion and muscle strengthening exercises prescribed by your doctor. Safety measures are also important to protect the joint from further injury. Any time any of these exercises cause you to have increased pain or swelling, decrease what you are doing until you are comfortable again and then slowly increase them. If you have problems or questions, call your caregiver or physical therapist for advice.  ° °Rehabilitation is important following a joint replacement. After just a few days of immobilization, the muscles of the leg can become weakened and shrink (atrophy).  These exercises are designed to build up the tone and strength of the thigh and leg muscles and to improve motion. Often times heat used for twenty to thirty minutes before working out will loosen up your tissues and help with improving the range of motion but do not use heat for the first two weeks following surgery (sometimes heat can increase post-operative swelling).  ° °These exercises can be done on a training (exercise) mat, on the floor, on a table or on a bed. Use whatever works the best and is most comfortable for you.    Use music or television while you are exercising so that   the exercises are a pleasant break in your day. This will make your life better with the exercises acting as a break in your routine that you can look forward to.   Perform all exercises about fifteen times, three times per day or as directed.  You should exercise both the operative leg and the other leg as well. ° °Exercises include: °  °• Quad Sets - Tighten up the muscle on the front of the thigh (Quad) and hold for 5-10 seconds.   °• Straight Leg Raises - With your knee straight (if you were given a brace, keep it on),  lift the leg to 60 degrees, hold for 3 seconds, and slowly lower the leg.  Perform this exercise against resistance later as your leg gets stronger.  °• Leg Slides: Lying on your back, slowly slide your foot toward your buttocks, bending your knee up off the floor (only go as far as is comfortable). Then slowly slide your foot back down until your leg is flat on the floor again.  °• Angel Wings: Lying on your back spread your legs to the side as far apart as you can without causing discomfort.  °• Hamstring Strength:  Lying on your back, push your heel against the floor with your leg straight by tightening up the muscles of your buttocks.  Repeat, but this time bend your knee to a comfortable angle, and push your heel against the floor.  You may put a pillow under the heel to make it more comfortable if necessary.  ° °A rehabilitation program following joint replacement surgery can speed recovery and prevent re-injury in the future due to weakened muscles. Contact your doctor or a physical therapist for more information on knee rehabilitation.  ° ° °CONSTIPATION ° °Constipation is defined medically as fewer than three stools per week and severe constipation as less than one stool per week.  Even if you have a regular bowel pattern at home, your normal regimen is likely to be disrupted due to multiple reasons following surgery.  Combination of anesthesia, postoperative narcotics, change in appetite and fluid intake all can affect your bowels.  ° °YOU MUST use at least one of the following options; they are listed in order of increasing strength to get the job done.  They are all available over the counter, and you may need to use some, POSSIBLY even all of these options:   ° °Drink plenty of fluids (prune juice may be helpful) and high fiber foods °Colace 100 mg by mouth twice a day  °Senokot for constipation as directed and as needed Dulcolax (bisacodyl), take with full glass of water  °Miralax (polyethylene glycol)  once or twice a day as needed. ° °If you have tried all these things and are unable to have a bowel movement in the first 3-4 days after surgery call either your surgeon or your primary doctor.   ° °If you experience loose stools or diarrhea, hold the medications until you stool forms back up.  If your symptoms do not get better within 1 week or if they get worse, check with your doctor.  If you experience "the worst abdominal pain ever" or develop nausea or vomiting, please contact the office immediately for further recommendations for treatment. ° ° °ITCHING:  If you experience itching with your medications, try taking only a single pain pill, or even half a pain pill at a time.  You can also use Benadryl over the counter for itching or also to   help with sleep.   TED HOSE STOCKINGS:  Use stockings on both legs until for at least 2 weeks or as directed by physician office. They may be removed at night for sleeping.  MEDICATIONS:  See your medication summary on the After Visit Summary that nursing will review with you.  You may have some home medications which will be placed on hold until you complete the course of blood thinner medication.  It is important for you to complete the blood thinner medication as prescribed.  PRECAUTIONS:  If you experience chest pain or shortness of breath - call 911 immediately for transfer to the hospital emergency department.   If you develop a fever greater that 101 F, purulent drainage from wound, increased redness or drainage from wound, foul odor from the wound/dressing, or calf pain - CONTACT YOUR SURGEON.                                                   FOLLOW-UP APPOINTMENTS:  If you do not already have a post-op appointment, please call the office for an appointment to be seen by your surgeon.  Guidelines for how soon to be seen are listed in your After Visit Summary, but are typically between 1-4 weeks after surgery.  OTHER INSTRUCTIONS:   Knee  Replacement:  Do not place pillow under knee, focus on keeping the knee straight while resting. CPM instructions: 0-90 degrees, 2 hours in the morning, 2 hours in the afternoon, and 2 hours in the evening. Place foam block, curve side up under heel at all times except when in CPM or when walking.  DO NOT modify, tear, cut, or change the foam block in any way.  MAKE SURE YOU:   Understand these instructions.   Get help right away if you are not doing well or get worse.    Thank you for letting us be a part of your medical care team.  It is a privilege we respect greatly.  We hope these instructions will help you stay on track for a fast and full recovery!   Information on my medicine - XARELTO (Rivaroxaban)  This medication education was reviewed with me or my healthcare representative as part of my discharge preparation.  The pharmacist that spoke with me during my hospital stay was:  Minda Ditto, [facility]  Why was Xarelto prescribed for you? Xarelto was prescribed for you to reduce the risk of blood clots forming after orthopedic surgery. The medical term for these abnormal blood clots is venous thromboembolism (VTE).  What do you need to know about xarelto ? Take your Xarelto ONCE DAILY at the same time every day. You may take it either with or without food.  If you have difficulty swallowing the tablet whole, you may crush it and mix in applesauce just prior to taking your dose.  Take Xarelto exactly as prescribed by your doctor and DO NOT stop taking Xarelto without talking to the doctor who prescribed the medication.  Stopping without other VTE prevention medication to take the place of Xarelto may increase your risk of developing a clot.  After discharge, you should have regular check-up appointments with your healthcare provider that is prescribing your Xarelto.    What do you do if you miss a dose? If you miss a dose, take it as soon as you  remember on the same day then  continue your regularly scheduled once daily regimen the next day. Do not take two doses of Xarelto on the same day.   Important Safety Information A possible side effect of Xarelto is bleeding. You should call your healthcare provider right away if you experience any of the following: ? Bleeding from an injury or your nose that does not stop. ? Unusual colored urine (red or dark brown) or unusual colored stools (red or black). ? Unusual bruising for unknown reasons. ? A serious fall or if you hit your head (even if there is no bleeding).  Some medicines may interact with Xarelto and might increase your risk of bleeding while on Xarelto. To help avoid this, consult your healthcare provider or pharmacist prior to using any new prescription or non-prescription medications, including herbals, vitamins, non-steroidal anti-inflammatory drugs (NSAIDs) and supplements.  This website has more information on Xarelto: [email or web address]

## 2016-08-01 NOTE — Evaluation (Signed)
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 Hx of obesity   Clinical Impression   Pt is s/p TKA resulting in the deficits listed below (see OT Problem List).  Pt will benefit from skilled OT to increase their safety and independence with ADL and functional mobility for ADL to facilitate discharge to venue listed below.        Follow Up Recommendations  SNF    Equipment Recommendations  None recommended by OT       Precautions / Restrictions Precautions Precautions: Fall;Knee Required Braces or Orthoses: Knee Immobilizer - Right Knee Immobilizer - Right: Discontinue once straight leg raise with < 10 degree lag Restrictions Weight Bearing Restrictions: No RLE Weight Bearing: Weight bearing as tolerated      Mobility Bed Mobility Overal bed mobility: Needs Assistance Bed Mobility: Supine to Sit     Supine to sit: Min assist;HOB elevated     General bed mobility comments: pt in chair  Transfers Overall transfer level: Needs assistance Equipment used: Rolling walker (2 wheeled) Transfers: Sit to/from Stand Sit to Stand: Mod assist;Max assist         General transfer comment: frmo recliner         ADL Overall ADL's : Needs assistance/impaired Eating/Feeding: Set up;Sitting   Grooming: Set up;Sitting   Upper Body Bathing: Minimal assitance;Sitting   Lower Body Bathing: Maximal assistance;Sit to/from stand;Cueing for safety;Cueing for sequencing           Toilet Transfer: RW;Maximal assistance;Cueing for sequencing;Cueing for safety Toilet Transfer Details (indicate cue type and reason): sit to stand Toileting- Clothing Manipulation and Hygiene: Total assistance;Sit to/from stand;Cueing for safety;Cueing for sequencing;Cueing for compensatory techniques         General ADL Comments: Pt will need SNF                Pertinent Vitals/Pain Pain Assessment: Faces Pain Score: 5  Faces Pain Scale: Hurts even more Pain Location: r knee Pain Descriptors / Indicators: Aching;Sore Pain Intervention(s): Monitored during session;Premedicated before session;Repositioned;Ice applied        Extremity/Trunk Assessment Upper Extremity Assessment Upper Extremity Assessment: Generalized weakness           Communication Communication Communication: No difficulties   Cognition Arousal/Alertness: Lethargic;Suspect due to medications [facility] tasks assessed/performed Overall Cognitive Status: Within Functional Limits for tasks assessed                     General Comments   Pt will need SNF             Home Living Family/patient expects to be discharged to:: Skilled nursing facility Living Arrangements: Children                           Home Equipment: [employer]ane - single point          Prior Functioning/Environment Level of Independence: Independent                 OT Problem List: Decreased strength;Decreased activity tolerance;Pain   OT Treatment/Interventions: Self-care/ADL training;DME and/or AE instruction;Patient/family education    OT Goals(Current goals can be found in the care plan section) Acute Rehab OT Goals Patient Stated Goal: less pain.  OT Goal Formulation: With patient Time For Goal Achievement: 08/15/16  OT Frequency:  Min 2X/week   Barriers to D/C: Decreased caregiver support             End of Session Equipment Utilized During Treatment: Engineer, water Communication: Mobility status  Activity Tolerance: Patient limited by fatigue;Patient limited by pain Patient left: in chair;with call bell/phone within reach;with chair alarm set   Time: 1010-1035 OT Time Calculation (min): 25 min Charges:  OT General Charges $OT Visit: 1 Procedure OT Evaluation $OT Eval Moderate Complexity: 1  Procedure OT Treatments $Self Care/Home Management : 8-22 mins G-Codes:    Einar Crow D 2016-08-04, 11:09 AM

## 2016-08-01 NOTE — Clinical Social Work Note (Signed)
Clinical Social Work Assessment  Patient Details  Name: Andrea Glass MRN: [id number] Date of Birth: Jul 18, 1951  Date of referral:  08/01/16               Reason for consult:  Facility Placement, Discharge Planning                Permission sought to share information with:  Family Supports Permission granted to share information::  Yes, Verbal Permission Granted  Name::     Seth Bake [place]  Relationship::  daughgter  Contact Information:  [phone]  Housing/Transportation Living arrangements for the past 2 months:  [place] of Information:  Patient, Adult Children Patient Interpreter Needed:  None Criminal Activity/Legal Involvement Pertinent to Current Situation/Hospitalization:  No - Comment as needed Significant Relationships:  Adult Children Lives with:  Self Do you feel safe going back to the place where you live?  Yes Need for family participation in patient care:  Yes (Comment)  Care giving concerns:  No care giving concerns identified.   Social Worker assessment / plan:  CSW met with pt to address consult. CSW introduced herself and explained role of social work. CSW also explained process of discharging to SNF. Pt shared that her daughter has information about SNF placement. CSW spoke with pt's daughter, who shared that the preferred choice is [facility], however is open to other facilities in [place]. CSW initiated SNF search and will follow up with bed offers. [employer] prior Josem Kaufmann will need to initiated by facility. CSW will continue to follow.   Employment status:  Other (Comment) Insurance information:  Managed Care PT Recommendations:  [place] / Referral to community resources:  [place]  Patient/Family's Response to care:  Pt and daughter were appreciative of CSW support.   Patient/Family's Understanding of and Emotional Response to Diagnosis, Current Treatment, and Prognosis:  Pt  understands that she needs rehab prior to returning home.   Emotional Assessment Appearance:  Appears stated age Attitude/Demeanor/Rapport:  Other (Appropriate) Affect (typically observed):  Accepting, Adaptable, Pleasant Orientation:  Oriented to Self, Oriented to Place, Oriented to  Time, Oriented to Situation Alcohol / Substance use:  Never Used Psych involvement (Current and /or in the community):  No (Comment)  Discharge Needs  Concerns to be addressed:  Adjustment to Illness Readmission within the last 30 days:  No Current discharge risk:  None Barriers to Discharge:  Continued Medical Work up   [employer], LCSW 08/01/2016, 2:07 PM

## 2016-08-02 ENCOUNTER — Encounter (HOSPITAL_COMMUNITY): Payer: Self-pay | Admitting: Orthopaedic Surgery

## 2016-08-02 NOTE — Clinical Social Work Note (Signed)
MSW met with patient at bedside and presented bed offers. Patient chooses [facility]. MSW notified facility to start [facility] authorization.   PT/OT evals sent.   No further concerns reported at this time. MSW remains available as needed.   Glendon Axe, MSW [phone] 08/02/2016 10:45 AM

## 2016-08-02 NOTE — Progress Notes (Signed)
Subjective: 3 Days Post-Op Procedure(s) (LRB): RIGHT TOTAL KNEE ARTHROPLASTY (Right) Patient reports pain as moderate.Wants PRN Albuterol. No complaints otherwise. Objective: Vital signs in last 24 hours: Temp:  [99.2 F (37.3 C)-99.4 F (37.4 C)] 99.4 F (37.4 C) (10/16 0520) Pulse Rate:  [78-93] 93 (10/16 0520) Resp:  [18] 18 (10/16 0520) BP: (126-177)/(57-72) 177/57 (10/16 0520) SpO2:  [91 %-97 %] 97 % (10/16 0520)  Intake/Output from previous day: 10/15 0701 - 10/16 0700 In: 960 [P.O.:960] Out: 2350 [Urine:2350] Intake/Output this shift: No intake/output data recorded.   Recent Labs  07/31/16 0510  HGB 10.8*    Recent Labs  07/31/16 0510  WBC 10.6*  RBC 4.55  HCT 34.0*  PLT 211    Recent Labs  07/31/16 0510  NA 133*  K 3.7  CL 100*  CO2 23  BUN 18  CREATININE 1.26*  GLUCOSE 171*  CALCIUM 9.1   No results for input(s): LABPT, INR in the last 72 hours.  Sensation intact distally Intact pulses distally Dorsiflexion/Plantar flexion intact Incision: dressing C/D/I Compartment soft  Assessment/Plan: 3 Days Post-Op Procedure(s) (LRB): RIGHT TOTAL KNEE ARTHROPLASTY (Right) Up with therapy Discharge to SNF when bed available.   Andrea Glass 08/02/2016, 7:57 AM

## 2016-08-02 NOTE — Progress Notes (Signed)
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 Hx of obesity    PT Comments    Progressing slowly with mobility. Distance  limited by fatigue, pain. Continue to recommend SNF.   Follow Up Recommendations  SNF     Equipment Recommendations       Recommendations for Other Services       Precautions / Restrictions Precautions Precautions: Fall;Knee Required Braces or Orthoses: Knee Immobilizer - Right Knee Immobilizer - Right: Discontinue once straight leg raise with < 10 degree lag Restrictions Weight Bearing Restrictions: No RLE Weight Bearing: Weight bearing as tolerated    Mobility  Bed Mobility               General bed mobility comments: sitting EOB  Transfers Overall transfer level: Needs assistance Equipment used: Rolling walker (2 wheeled) Transfers: Sit to/from Stand Sit to Stand: Mod assist;From elevated surface         General transfer comment: Assist to rise, stabilize, control descent.   Ambulation/Gait Ambulation/Gait assistance: Min assist;+2 safety/equipment Ambulation Distance (Feet): 16 Feet Assistive device: Rolling walker (2 wheeled) Gait Pattern/deviations: Step-to pattern;Antalgic;Trunk flexed     General Gait Details: Assist to stabilize pt and follow closely with recliner. Pt fatiguse fairly easily.    Stairs            Wheelchair Mobility    Modified Rankin (Stroke Patients Only)       Balance                                    Cognition Arousal/Alertness: Awake/alert Behavior During Therapy: WFL for tasks assessed/performed Overall Cognitive Status: Within Functional Limits for tasks assessed                      Exercises Total Joint Exercises Ankle Circles/Pumps: AROM;Both;10 reps;Supine Quad Sets: AROM;Both;10 reps;Supine Heel Slides:  AAROM;Right;10 reps;Supine Hip ABduction/ADduction: AAROM;Right;10 reps;Supine Straight Leg Raises: AAROM;Right;10 reps;Supine    General Comments        Pertinent Vitals/Pain Pain Assessment: Faces Faces Pain Scale: Hurts even more Pain Location: R knee with activity Pain Descriptors / Indicators: Sore;Aching Pain Intervention(s): Limited activity within patient's tolerance;Repositioned;Ice applied    Home Living                      Prior Function            PT Goals (current goals can now be found in the care plan section) Progress towards PT goals: Progressing toward goals    Frequency    7X/week      PT Plan Current plan remains appropriate    Co-evaluation             End of Session Equipment Utilized During Treatment: Right knee immobilizer Activity Tolerance: Patient tolerated treatment well Patient left: in chair;with call bell/phone within reach     Time: [id number] PT Time Calculation (min) (ACUTE ONLY): 14 min  Charges:  $Gait Training: 8-22 mins                    G Codes:      Rebeca AlertJannie Abaigeal Moomaw, MPT Pager: [phone]

## 2016-08-02 NOTE — Progress Notes (Signed)
Occupational Therapy Treatment Patient Details Name: Andrea Glass MRN: [id number] DOB: [birth year] Today's Date: 08/02/2016    History of present illness 65 yo female s/p R TKA 07/30/16. Hx of obesity   OT comments  Progressing slowly towards OT goals. Continue OT per plan of care.  Follow Up Recommendations  SNF    Equipment Recommendations  None recommended by OT    Recommendations for Other Services      Precautions / Restrictions Precautions Precautions: Fall;Knee Required Braces or Orthoses: Knee Immobilizer - Right Knee Immobilizer - Right: Discontinue once straight leg raise with < 10 degree lag Restrictions Weight Bearing Restrictions: No RLE Weight Bearing: Weight bearing as tolerated       Mobility Bed Mobility Overal bed mobility: Needs Assistance Bed Mobility: Supine to Sit     Supine to sit: Min assist;HOB elevated     General bed mobility comments: assist for RLE, increased time, use of rails, HOB all the way up  Transfers Overall transfer level: Needs assistance Equipment used: Rolling walker (2 wheeled) Transfers: Sit to/from [employer]Stand;Stand Pivot Transfers Sit to Stand: Mod assist;From elevated surface Stand pivot transfers: Min assist       General transfer comment: Assist to rise, stabilize, control descent.     Balance                                   ADL Overall ADL's : Needs assistance/impaired Eating/Feeding: Independent;Bed level                       Toilet Transfer: Moderate assistance;Stand-pivot;BSC;RW   Toileting- Clothing Manipulation and Hygiene: Minimal assistance;Moderate assistance;Sit to/from stand       Functional mobility during ADLs: Moderate assistance;Minimal assistance;Rolling walker General ADL Comments: Patient reported "sinus headache" but agreeable for toileting task. After toileting task, pt desired to ambulate in halls. Located PT who came in to work with patient. Notified nurse of  pt's request for medication for sinus headache. Continue to recommend SNF.      Vision                     Perception     Praxis      Cognition   Behavior During Therapy: WFL for tasks assessed/performed Overall Cognitive Status: Within Functional Limits for tasks assessed                       Extremity/Trunk Assessment               Exercises    Shoulder Instructions       General Comments      Pertinent Vitals/ Pain       Pain Assessment: 0-10 Pain Score: 8  Faces Pain Scale: Hurts even more Pain Location:  (R knee with activity, sinus headache) Pain Descriptors / Indicators: Aching;Sore Pain Intervention(s): Monitored during session;Repositioned;Patient requesting pain meds-RN notified  Home Living                                          Prior Functioning/Environment              Frequency  Min 2X/week        Progress Toward Goals  OT Goals(current goals can now be found in the care  plan section)  Progress towards OT goals: Progressing toward goals  Acute Rehab OT Goals Patient Stated Goal: less pain.   Plan Discharge plan remains appropriate    Co-evaluation                 End of Session Equipment Utilized During Treatment: Rolling walker;Right knee immobilizer   Activity Tolerance Patient tolerated treatment well   Patient Left Other (comment) (in care of physical therapist)   Nurse Communication Patient requests pain meds        Time: 1610-9604 OT Time Calculation (min): 20 min  Charges: OT General Charges $OT Visit: 1 Procedure OT Treatments $Self Care/Home Management : 8-22 mins  Saria Haran A 08/02/2016, 1:54 PM

## 2016-08-03 NOTE — Progress Notes (Signed)
CSW assisting with d/c planning. Pt is ready for d/c today. CSW has left several messages for [facility] to check status of BCBS authorization, as well as offer SNF 5 day LOG. Awaiting return call. ED CSW # has been left for SNF to contact if SNF is able to admit today. Pt reports that she has spoken with SNF and they told her there would be no bed available until Wed. CSW will continue to follow to assist with d/c planning to SNF.  Asher MuirJamie Danaja [place]ota LCSW [phone]

## 2016-08-03 NOTE — Progress Notes (Signed)
Physical Therapy Treatment Patient Details Name: Earnestine Mealingrene S Hopkin MRN: [id number] DOB: 04-16-1951 Today's Date: 08/03/2016    History of Present Illness 65 yo female s/p R TKA 07/30/16. Hx of obesity    PT Comments    The patient is progressing slowly. Fatigues quickly. Plans DC to SNF. Continue PT while in acute care,.  Follow Up Recommendations  SNF     Equipment Recommendations  None recommended by PT    Recommendations for Other Services       Precautions / Restrictions Precautions Precautions: Fall;Knee Required Braces or Orthoses: Knee Immobilizer - Right Knee Immobilizer - Right: Discontinue once straight leg raise with < 10 degree lag    Mobility  Bed Mobility Overal bed mobility: Needs Assistance Bed Mobility: Supine to Sit     Supine to sit: Min assist;HOB elevated     General bed mobility comments: assist for RLE, increased time, use of rails, HOB all the way up  Transfers Overall transfer level: Needs assistance Equipment used: Rolling walker (2 wheeled) Transfers: Sit to/from Stand Sit to Stand: Min assist;From elevated surface         General transfer comment: Assist to rise, stabilize, control descent. Tends to flop, uncontrolled descent  Ambulation/Gait Ambulation/Gait assistance: Min assist Ambulation Distance (Feet): 20 Feet Assistive device: Rolling walker (2 wheeled) Gait Pattern/deviations: Step-to pattern;Decreased stance time - right;Antalgic;Trunk flexed     General Gait Details: Assist to stabilize pt and follow closely with recliner. Pt fatiguse fairly easily.    Stairs            Wheelchair Mobility    Modified Rankin (Stroke Patients Only)       Balance                                    Cognition Arousal/Alertness: Awake/alert                          Exercises Total Joint Exercises Ankle Circles/Pumps: AROM Quad Sets: AROM;Both;10 reps;Supine Heel Slides: AAROM;Right;10  reps;Supine Hip ABduction/ADduction: AAROM;Right;10 reps;Supine Straight Leg Raises: AAROM;Right;10 reps;Supine Goniometric ROM: -5-45 right knee    General Comments        Pertinent Vitals/Pain Pain Score: 6  Pain Location: R knee Pain Descriptors / Indicators: Aching;Sore;Discomfort Pain Intervention(s): Limited activity within patient's tolerance;Monitored during session;Ice applied;Patient requesting pain meds-RN notified    Home Living                      Prior Function            PT Goals (current goals can now be found in the care plan section) Progress towards PT goals: Progressing toward goals    Frequency    7X/week      PT Plan Current plan remains appropriate    Co-evaluation             End of Session Equipment Utilized During Treatment: Right knee immobilizer Activity Tolerance: Patient tolerated treatment well Patient left: in chair;with call bell/phone within reach     Time: 1520-1545 PT Time Calculation (min) (ACUTE ONLY): 25 min  Charges:  $Gait Training: 8-22 mins $Therapeutic Exercise: 8-22 mins                    G Codes:      Rada HayHill, Xochilt Conant Elizabeth 08/03/2016, 4:39 PM Blanchard KelchKaren Darcy Barbara PT  [phone]   

## 2016-08-03 NOTE — Discharge Summary (Signed)
Patient ID: Andrea Glass MRN: [id number] DOB/AGE: [birth year] 65 y.o.  Admit date: 07/30/2016 Discharge date: 08/03/2016  Admission Diagnoses:  Principal Problem:   Osteoarthritis of right knee Active Problems:   Status post total right knee replacement   Discharge Diagnoses:  Same  Past Medical History:  Diagnosis Date  . Arthritis   . Asthma   . Depression    when mother passed away  . Dysrhythmia   . GERD (gastroesophageal reflux disease)   . History of kidney stones   . Hypertension   . Pneumonia   . Pre-diabetes   . Sleep apnea    does not use CPAP    Surgeries: Procedure(s): RIGHT TOTAL KNEE ARTHROPLASTY on 07/30/2016   Consultants:   Discharged Condition: Improved  Hospital Course: Andrea Glass is an 65 y.o. female who was admitted 07/30/2016 for operative treatment ofOsteoarthritis of right knee. Patient has severe unremitting pain that affects sleep, daily activities, and work/hobbies. After pre-op clearance the patient was taken to the operating room on 07/30/2016 and underwent  Procedure(s): RIGHT TOTAL KNEE ARTHROPLASTY.    Patient was given perioperative antibiotics: Anti-infectives    Start     Dose/Rate Route Frequency Ordered Stop   07/30/16 2100  ceFAZolin (ANCEF) IVPB 2g/100 mL premix     2 g 200 mL/hr over 30 Minutes Intravenous Every 6 hours 07/30/16 1854 07/31/16 0314   07/30/16 0600  ceFAZolin (ANCEF) 3 g in dextrose 5 % 50 mL IVPB     3 g 130 mL/hr over 30 Minutes Intravenous On call to O.R. 07/29/16 1317 07/30/16 1521       Patient was given sequential compression devices, early ambulation, and chemoprophylaxis to prevent DVT.  Patient benefited maximally from hospital stay and there were no complications.    Recent vital signs: Patient Vitals for the past 24 hrs:  BP Temp Temp src Pulse Resp SpO2  08/03/16 0555 (!) 103/58 98.8 F (37.1 C) Oral 87 20 98 %  08/02/16 2155 101/62 98.3 F (36.8 C) Oral 83 17 97 %  08/02/16  1502 (!) 115/54 97.7 F (36.5 C) Oral 76 17 93 %  08/02/16 0812 - - - 95 18 97 %     Recent laboratory studies: No results for input(s): WBC, HGB, HCT, PLT, NA, K, CL, CO2, BUN, CREATININE, GLUCOSE, INR, CALCIUM in the last 72 hours.  Invalid input(s): PT, 2   Discharge Medications:     Medication List    STOP taking these medications   HYDROcodone-acetaminophen 5-325 MG tablet Commonly known as:  NORCO   ibuprofen 800 MG tablet Commonly known as:  ADVIL,MOTRIN   meloxicam 7.5 MG tablet Commonly known as:  MOBIC   metroNIDAZOLE 500 MG tablet Commonly known as:  FLAGYL     TAKE these medications   acetaminophen 500 MG tablet Commonly known as:  TYLENOL Take 500-1,000 mg by mouth every 6 (six) hours as needed (for pain.).   ALKA-SELTZER PLUS COLD & COUGH 02-16-09-325 MG Caps Generic drug:  Phenyleph-CPM-DM-APAP Take 1-2 tablets by mouth every 6 (six) hours as needed (for cold symptoms.).   cyclobenzaprine 5 MG tablet Commonly known as:  FLEXERIL Take 2 tablets (10 mg total) by mouth 3 (three) times daily as needed for muscle spasms. What changed:  how much to take   fluticasone 50 MCG/ACT nasal spray Commonly known as:  FLONASE Place 1-2 sprays into both nostrils daily as needed for allergies.   hydrochlorothiazide 25 MG tablet Commonly known as:  HYDRODIURIL Take 25 mg by mouth daily.   oxyCODONE-acetaminophen 5-325 MG tablet Commonly known as:  ROXICET Take 1-2 tablets by mouth every 4 (four) hours as needed.   oxymetazoline 0.05 % nasal spray Commonly known as:  AFRIN Place 1 spray into both nostrils 2 (two) times daily as needed for congestion.   predniSONE 10 MG tablet Commonly known as:  DELTASONE Take 1 tablet (10 mg total) by mouth 2 (two) times daily with a meal.   ranitidine 150 MG tablet Commonly known as:  ZANTAC Take 150 mg by mouth 2 (two) times daily.   rivaroxaban 10 MG Tabs tablet Commonly known as:  XARELTO Take 1 tablet (10 mg total)  by mouth daily with breakfast.   sertraline 50 MG tablet Commonly known as:  ZOLOFT Take 50 mg by mouth daily as needed. For mood enhancer.   THERAFLU SEVERE COLD & COUGH Packet Misc Generic drug:  Phenyleph-Diphenhyd-DM-APAP Take 1 packet by mouth at bedtime as needed (for cold symptoms.).   VENTOLIN HFA 108 (90 Base) MCG/ACT inhaler Generic drug:  albuterol Inhale 1-2 puffs into the lungs every 6 (six) hours as needed for shortness of breath or wheezing.       Diagnostic Studies: Dg Knee Right Port  Result Date: 07/30/2016 CLINICAL DATA:  Status post knee arthroscopy. EXAM: PORTABLE RIGHT KNEE - 1-2 VIEW COMPARISON:  None. FINDINGS: The patient is status post right knee replacement. Femoral and tibial components are in good position. No fractures. Soft tissue air consistent with recent procedure. No other acute abnormalities. IMPRESSION: Soft tissue air consistent with recent procedure. No other abnormalities. Electronically Signed   By: Gerome Sam III M.D   On: 07/30/2016 17:40    Disposition: to skilled nursing  Discharge Instructions    Call MD / Call 911    Complete by:  As directed    If you experience chest pain or shortness of breath, CALL 911 and be transported to the hospital emergency room.  If you develope a fever above 101 F, pus (white drainage) or increased drainage or redness at the wound, or calf pain, call your surgeon's office.   Constipation Prevention    Complete by:  As directed    Drink plenty of fluids.  Prune juice may be helpful.  You may use a stool softener, such as Colace (over the counter) 100 mg twice a day.  Use MiraLax (over the counter) for constipation as needed.   Diet - low sodium heart healthy    Complete by:  As directed    Discharge patient    Complete by:  As directed    Increase activity slowly as tolerated    Complete by:  As directed        Contact information for follow-up providers    Kathryne Hitch, MD Follow up in  2 week(s).   Specialty:  Orthopedic Surgery Contact information: [address] [place] [address] [phone]            Contact information for after-discharge care    Destination    [facility] .   Specialty:  Skilled Nursing Facility Contact information: [address] [place] [address] [phone]                   Signed: Kathryne Hitch 08/03/2016, 6:35 AM

## 2016-08-03 NOTE — Progress Notes (Signed)
Patient ID: Andrea Glass, female   DOB: 05/31/1951, 65 y.o.   MRN: [id number] Doing well.  Can be discharged to SNF today.

## 2016-08-04 NOTE — Clinical Social Work Placement (Signed)
   CLINICAL SOCIAL WORK PLACEMENT  NOTE  Date:  08/04/2016  Patient Details  Name: Andrea Glass MRN: [id number] Date of Birth: [birth year]  Clinical Social Work is seeking post-discharge placement for this patient at the Skilled  Nursing Facility level of care (*CSW will initial, date and re-position this form in  chart as items are completed):  Yes   Patient/family provided with [facility] Clinical Social Work Department's list of facilities offering this level of care within the geographic area requested by the patient (or if unable, by the patient's family).  Yes   Patient/family informed of their freedom to choose among providers that offer the needed level of care, that participate in Medicare, Medicaid or managed care program needed by the patient, have an available bed and are willing to accept the patient.  Yes   Patient/family informed of [facility]'s ownership interest in [facility] and [facility], as well as of the fact that they are under no obligation to receive care at these facilities.  PASRR submitted to EDS on 08/01/16     PASRR number received on 08/01/16     Existing PASRR number confirmed on       FL2 transmitted to all facilities in geographic area requested by pt/family on 08/01/16     FL2 transmitted to all facilities within larger geographic area on       Patient informed that his/her managed care company has contracts with or will negotiate with certain facilities, including the following:        Yes   Patient/family informed of bed offers received.  Patient chooses bed at [facility]     Physician recommends and patient chooses bed at      Patient to be transferred to [facility] on 08/04/16.  Patient to be transferred to facility by [facility]     Patient family notified on 08/04/16 of transfer.  Name of family member notified:  Pt contacted daughter directly.     PHYSICIAN       Additional Comment: Pt is  in agreement with d/c to [facility] today. SNF has received BCBS authorization. [facility] transport is needed. Pt is aware out of pocket costs may be associated with [facility] transport. Medical necessity form completed. D/C Summary sent to SNF for review. Scripts included in d/c packet. # for report provided to nsg.   _______________________________________________ Royetta AsalHaidinger, Manasi Dishon [place], [place]  [phone]  [phone] 08/04/2016, 10:34 AM

## 2016-08-04 NOTE — Progress Notes (Signed)
Report called to selicia at [facility].  Sharrell Ku Elam Ellis RN

## 2016-08-11 ENCOUNTER — Telehealth (INDEPENDENT_AMBULATORY_CARE_PROVIDER_SITE_OTHER): Payer: Self-pay | Admitting: *Deleted

## 2016-08-11 NOTE — Telephone Encounter (Signed)
Pt. Needs 2 week F/U from surgery on 10/13. Please call [facility] to schedule appt.

## 2016-08-12 NOTE — Anesthesia Postprocedure Evaluation (Signed)
Anesthesia Post Note  Patient: Andrea Glass  Procedure(s) Performed: Procedure(s) (LRB): RIGHT TOTAL KNEE ARTHROPLASTY (Right)  Patient location during evaluation: PACU Anesthesia Type: General Pain management: pain level controlled Vital Signs Assessment: post-procedure vital signs reviewed and stable Respiratory status: spontaneous breathing Cardiovascular status: stable Anesthetic complications: no    Last Vitals:  Vitals:   08/04/16 0544 08/04/16 1031  BP: (!) 102/55 (!) 100/55  Pulse: 81 84  Resp: 20   Temp: 36.9 C     Last Pain:  Vitals:   08/04/16 1043  TempSrc:   PainSc: 7                  EDWARDS,Talasia Saulter

## 2016-08-12 NOTE — Telephone Encounter (Signed)
Appointment made

## 2016-08-16 ENCOUNTER — Ambulatory Visit (INDEPENDENT_AMBULATORY_CARE_PROVIDER_SITE_OTHER): Payer: Federal, State, Local not specified - PPO | Admitting: Orthopaedic Surgery

## 2016-08-16 DIAGNOSIS — Z96651 Presence of right artificial knee joint: Secondary | ICD-10-CM

## 2016-08-16 NOTE — Progress Notes (Signed)
Miss newborn is 2 weeks status post a right total knee arthroplasty. She is staying at a nursing care facility. She is working on rehabilitation of her right knee. He has no complaints.  Examination of her right knee shows almost full extension but flexion to only about 70. Her incision is well-healed remove the dressing and the Steri-Strips. I did place new Steri-Strips. Calf is soft.  I stressed the importance of aggressive range of motion of her right knee. She says she is leaving the skilled nursing facility on Wednesday and will transition to home health care. I will see her back in 4 weeks and hopefully can get her into outpatient physical therapy then. I cautioned her about the need for a manipulation under anesthesia if her motion doesn't improve.

## 2016-08-19 ENCOUNTER — Ambulatory Visit (INDEPENDENT_AMBULATORY_CARE_PROVIDER_SITE_OTHER): Payer: Federal, State, Local not specified - PPO | Admitting: Orthopaedic Surgery

## 2016-08-26 ENCOUNTER — Telehealth (INDEPENDENT_AMBULATORY_CARE_PROVIDER_SITE_OTHER): Payer: Self-pay | Admitting: Orthopaedic Surgery

## 2016-08-26 MED ORDER — OXYCODONE-ACETAMINOPHEN 5-325 MG PO TABS: 1.0000 | ORAL_TABLET | Freq: Four times a day (QID) | ORAL | 0 refills | Status: DC | PRN

## 2016-08-26 NOTE — Telephone Encounter (Signed)
Please advise 

## 2016-08-26 NOTE — Telephone Encounter (Signed)
Patient aware Rx ready at front desk  

## 2016-08-26 NOTE — Telephone Encounter (Signed)
Patient requesting a RX refill on oxycodone. Patient states that the feeling in her legs is coming back.   Contact Info: [phone]

## 2016-08-26 NOTE — Telephone Encounter (Signed)
Can come and pick up script 

## 2016-08-31 NOTE — Telephone Encounter (Signed)
PT. Called stating she is allowed to take a shower but the strips are falling off one by one. Pt wants to know id she should cover this or leave it open. Call back number [phone]

## 2016-08-31 NOTE — Telephone Encounter (Signed)
Please advise 

## 2016-08-31 NOTE — Telephone Encounter (Signed)
She should cover her knee with a new dressing

## 2016-08-31 NOTE — Telephone Encounter (Signed)
Patient aware of the below message  

## 2016-09-13 ENCOUNTER — Ambulatory Visit (INDEPENDENT_AMBULATORY_CARE_PROVIDER_SITE_OTHER): Payer: Federal, State, Local not specified - PPO | Admitting: Orthopaedic Surgery

## 2016-09-13 DIAGNOSIS — Z96651 Presence of right artificial knee joint: Secondary | ICD-10-CM

## 2016-09-13 MED ORDER — IBUPROFEN 800 MG PO TABS: 800.0000 mg | ORAL_TABLET | Freq: Three times a day (TID) | ORAL | 0 refills | Status: DC | PRN

## 2016-09-13 MED ORDER — HYDROCODONE-ACETAMINOPHEN 5-325 MG PO TABS: 1.0000 | ORAL_TABLET | Freq: Two times a day (BID) | ORAL | 0 refills | Status: DC | PRN

## 2016-09-13 MED ORDER — TIZANIDINE HCL 4 MG PO TABS: 4.0000 mg | ORAL_TABLET | Freq: Three times a day (TID) | ORAL | 0 refills | Status: DC | PRN

## 2016-09-13 NOTE — Progress Notes (Signed)
The patient is now 6 weeks status post a right total knee replacement. She has finished home therapy and is ready for outpatient therapy. She staying with [place] in [place]. She family with a rolling walker. Her right knee exam shows the knee to be at full extension. Her flexion is only to 90-95. It feels ligaments stable. The swelling is mild to moderate with no significant effusion itself. The incisions well-healed. The knee feels MC stable.  At this point is essentially getting outpatient therapy in [place] at [facility]. I gave her prescription for hydrocodone as well as Zanaflex and ibuprofen. We'll see her back in 4 weeks to see how her range of motion is doing. No x-rays are needed.

## 2016-09-14 ENCOUNTER — Telehealth (INDEPENDENT_AMBULATORY_CARE_PROVIDER_SITE_OTHER): Payer: Self-pay | Admitting: Orthopaedic Surgery

## 2016-09-14 NOTE — Telephone Encounter (Signed)
Patient needs Rx so her insurance will pay for it.

## 2016-09-14 NOTE — Telephone Encounter (Signed)
Patient is requesting a script for stool softener, says she needs one since she is taking pain medication.  [phone]

## 2016-09-15 NOTE — Telephone Encounter (Signed)
Rx called in 

## 2016-09-15 NOTE — Telephone Encounter (Signed)
Can come and pick up script 

## 2016-10-14 ENCOUNTER — Ambulatory Visit (INDEPENDENT_AMBULATORY_CARE_PROVIDER_SITE_OTHER): Payer: Federal, State, Local not specified - PPO | Admitting: Orthopaedic Surgery

## 2016-11-08 ENCOUNTER — Ambulatory Visit (INDEPENDENT_AMBULATORY_CARE_PROVIDER_SITE_OTHER): Payer: Federal, State, Local not specified - PPO | Admitting: Orthopaedic Surgery

## 2017-04-17 ENCOUNTER — Encounter: Payer: Self-pay | Admitting: Emergency Medicine

## 2017-04-17 ENCOUNTER — Emergency Department
Admission: EM | Admit: 2017-04-17 | Discharge: 2017-04-17 | Disposition: A | Discharge status: Home / Self Care | Payer: Medicare Other | Attending: Emergency Medicine | Admitting: Emergency Medicine

## 2017-04-17 DIAGNOSIS — M5442 Lumbago with sciatica, left side: Secondary | ICD-10-CM | POA: Diagnosis not present

## 2017-04-17 DIAGNOSIS — G8929 Other chronic pain: Secondary | ICD-10-CM

## 2017-04-17 DIAGNOSIS — M5432 Sciatica, left side: Secondary | ICD-10-CM

## 2017-04-17 DIAGNOSIS — J45909 Unspecified asthma, uncomplicated: Secondary | ICD-10-CM | POA: Insufficient documentation

## 2017-04-17 DIAGNOSIS — I10 Essential (primary) hypertension: Secondary | ICD-10-CM | POA: Diagnosis not present

## 2017-04-17 DIAGNOSIS — Z7901 Long term (current) use of anticoagulants: Secondary | ICD-10-CM | POA: Insufficient documentation

## 2017-04-17 DIAGNOSIS — M545 Low back pain: Secondary | ICD-10-CM | POA: Diagnosis present

## 2017-04-17 DIAGNOSIS — Z79899 Other long term (current) drug therapy: Secondary | ICD-10-CM | POA: Diagnosis not present

## 2017-04-17 DIAGNOSIS — Z91040 Latex allergy status: Secondary | ICD-10-CM | POA: Insufficient documentation

## 2017-04-17 MED ORDER — TRAMADOL HCL 50 MG PO TABS: 50.0000 mg | ORAL_TABLET | Freq: Four times a day (QID) | ORAL | 0 refills | Status: DC | PRN

## 2017-04-17 MED ORDER — DIAZEPAM 2 MG PO TABS
2.0000 mg | ORAL_TABLET | Freq: Once | ORAL | Status: AC
  Administered 2017-04-17: 2 mg via ORAL
  Filled 2017-04-17: qty 1

## 2017-04-17 MED ORDER — DEXAMETHASONE SODIUM PHOSPHATE 10 MG/ML IJ SOLN
10.0000 mg | Freq: Once | INTRAMUSCULAR | Status: AC
  Administered 2017-04-17: 10 mg via INTRAMUSCULAR
  Filled 2017-04-17: qty 1

## 2017-04-17 MED ORDER — CYCLOBENZAPRINE HCL 5 MG PO TABS: 5.0000 mg | ORAL_TABLET | Freq: Three times a day (TID) | ORAL | 0 refills | Status: DC | PRN

## 2017-04-17 MED ORDER — PREDNISONE 10 MG (21) PO TBPK: ORAL_TABLET | ORAL | 0 refills | Status: DC

## 2017-04-17 MED ORDER — OXYCODONE-ACETAMINOPHEN 5-325 MG PO TABS
1.0000 | ORAL_TABLET | Freq: Once | ORAL | Status: AC
  Administered 2017-04-17: 1 via ORAL
  Filled 2017-04-17: qty 1

## 2017-04-17 NOTE — ED Triage Notes (Signed)
C/O left lower back pain radiating down left leg.  States chronically has back pain, this episode started Thursday evening.

## 2017-04-17 NOTE — ED Provider Notes (Signed)
[facility] Emergency Department Provider Note   ____________________________________________   I have reviewed the triage vital signs and the nursing notes.   HISTORY  Chief Complaint Back Pain    HPI Andrea Glass is a 66 y.o. female presents with chronic back pain that now has rating pain down the left leg that began 3 days ago. Patient denies any recent trauma or fall prior to onset of symptoms. Patient describes pain as aching and occasionally sharp. Patient reports inability to find a comfortable position and increased pain when she stands or walks. Patient has been self treating with Tylenol without relief. Patient reports remote lumbar surgery however she is unable to describe details other than she had the surgery at [facility]. Patient denies left lower extremity sensation changes, bowel or bladder dysfunction or saddle anesthesia. Patient denies fever, chills, headache, vision changes, chest pain, chest tightness, shortness of breath, abdominal pain, nausea and vomiting.  Past Medical History:  Diagnosis Date  . Arthritis   . Asthma   . Depression    when mother passed away  . Dysrhythmia   . GERD (gastroesophageal reflux disease)   . History of kidney stones   . Hypertension   . Pneumonia   . Pre-diabetes   . Sleep apnea    does not use CPAP    Patient Active Problem List   Diagnosis Date Noted  . Osteoarthritis of right knee 07/30/2016  . 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 ABDOMINAL HYSTERECTOMY    . BACK SURGERY     cervical and lumbar  . ROTATOR CUFF REPAIR     right  . TOTAL KNEE ARTHROPLASTY Right 07/30/2016   Procedure: RIGHT TOTAL KNEE ARTHROPLASTY;  Surgeon: Kathryne Hitch, MD;  Location: [facility];  Service: Orthopedics;  Laterality: Right;    Prior to Admission medications   Medication Sig Start Date End Date Taking? Authorizing  Provider  acetaminophen (TYLENOL) 500 MG tablet Take 500-1,000 mg by mouth every 6 (six) hours as needed (for pain.).    [provider]  cyclobenzaprine (FLEXERIL) 5 MG tablet Take 1 tablet (5 mg total) by mouth 3 (three) times daily as needed. 04/17/17   Monroe Toure M, PA-C  fluticasone (FLONASE) 50 MCG/ACT nasal spray Place 1-2 sprays into both nostrils daily as needed for allergies.    [provider]  hydrochlorothiazide (HYDRODIURIL) 25 MG tablet Take 25 mg by mouth daily. 07/02/16   [provider]  HYDROcodone-acetaminophen (NORCO/VICODIN) 5-325 MG tablet Take 1-2 tablets by mouth 2 (two) times daily as needed for moderate pain. 09/13/16   Kathryne Hitch, MD  ibuprofen (ADVIL,MOTRIN) 800 MG tablet Take 1 tablet (800 mg total) by mouth every 8 (eight) hours as needed. 09/13/16   Kathryne Hitch, MD  oxyCODONE-acetaminophen (ROXICET) 5-325 MG tablet Take 1-2 tablets by mouth every 6 (six) hours as needed. 08/26/16   Kathryne Hitch, MD  oxymetazoline (AFRIN) 0.05 % nasal spray Place 1 spray into both nostrils 2 (two) times daily as needed for congestion.    [provider]  Phenyleph-CPM-DM-APAP (ALKA-SELTZER PLUS COLD & COUGH) 02-16-09-325 MG CAPS Take 1-2 tablets by mouth every 6 (six) hours as needed (for cold symptoms.).    [provider]  Phenyleph-Diphenhyd-DM-APAP [facility] SEVERE COLD & COUGH) Packet MISC Take 1 packet by mouth at bedtime as needed (for cold symptoms.).    [provider]  predniSONE (DELTASONE) 10 MG tablet Take 1 tablet (10 mg total) by mouth 2 (two) times daily with a meal. 06/15/16   Menshew, Charlesetta IvoryJenise V Bacon, PA-C  predniSONE (STERAPRED UNI-PAK 21 TAB) 10 MG (21) TBPK tablet Take 6 tablets on day 1. Take 5 tablets on day 2. Take 4 tablets on day 3. Take 3 tablets on day 4. Take 2 tablets on day 5. Take 1 tablets on day 6. 04/17/17   Majour Frei M, PA-C  ranitidine (ZANTAC) 150 MG tablet Take 150  mg by mouth 2 (two) times daily. 07/02/16   [provider]  rivaroxaban (XARELTO) 10 MG TABS tablet Take 1 tablet (10 mg total) by mouth daily with breakfast. 08/02/16   Kathryne HitchBlackman, Christopher Y, MD  sertraline (ZOLOFT) 50 MG tablet Take 50 mg by mouth daily as needed. For mood enhancer. 07/18/16   [provider]  tiZANidine (ZANAFLEX) 4 MG tablet Take 1 tablet (4 mg total) by mouth every 8 (eight) hours as needed for muscle spasms. 09/13/16   Kathryne HitchBlackman, Christopher Y, MD  traMADol (ULTRAM) 50 MG tablet Take 1 tablet (50 mg total) by mouth every 6 (six) hours as needed. 04/17/17   Trystyn Dolley M, PA-C  VENTOLIN HFA 108 (90 Base) MCG/ACT inhaler Inhale 1-2 puffs into the lungs every 6 (six) hours as needed for shortness of breath or wheezing. 07/18/16   [provider]    Allergies Latex  No family history on file.  Social History Social History  Substance Use Topics  . Smoking status: Never Smoker  . Smokeless tobacco: Never Used  . Alcohol use No    Review of Systems Constitutional: Negative for fever/chills Eyes: No visual changes. Cardiovascular: Denies chest pain. Gastrointestinal: No abdominal pain. . Musculoskeletal:Positive for lumbar back pain with radiating pain down the left lower extremity. Skin: Negative for rash. Neurological: Negative for headaches.  Negative focal weakness or numbness. Able to ambulate. ____________________________________________   PHYSICAL EXAM:  VITAL SIGNS: ED Triage Vitals  Enc Vitals Group     BP 04/17/17 1453 (!) 148/44     Pulse Rate 04/17/17 1453 71     Resp 04/17/17 1453 18     Temp 04/17/17 1453 98.1 F (36.7 C)     Temp Source 04/17/17 1453 Oral     SpO2 04/17/17 1453 96 %     Weight 04/17/17 1440 295 lb (133.8 kg)     Height 04/17/17 1440 5\' 5"  (1.651 m)     Head Circumference --      Peak Flow --      Pain Score 04/17/17 1440 8     Pain Loc --      Pain Edu? --      Excl. in GC? --      Constitutional: Alert and oriented. Well appearing and in no acute distress.  Head: Normocephalic and atraumatic. Cardiovascular: Normal rate, regular rhythm. Normal distal pulses. Respiratory: Normal respiratory effort.  Gastrointestinal: Soft and nontender.  Musculoskeletal: Lumbar back pain with intact range of motion all planes. Negative spinous process and transverse process tenderness. Palpable tenderness along the right paraspinals and gluteal musculature. Radicular pain radiating down the left lower extremity to the foot described as aching. Intact sensation to the bilateral lower extremities. Otherwise, nontender with normal range of motion in all extremities. Negative bowel or bladder dysfunction or saddle anesthesia. Neurologic: Normal speech and language. No gross focal neurologic deficits are appreciated. No gait instability. No sensory loss or abnormal reflexes.  Skin:  Skin is warm, dry and intact. No rash noted. Psychiatric: Mood and affect are normal. ____________________________________________   LABS (all labs ordered are listed, but only abnormal results are displayed)  Labs Reviewed - No data to display ____________________________________________  EKG none ____________________________________________  RADIOLOGY None ____________________________________________   PROCEDURES  Procedure(s) performed: No    Critical Care performed: no ____________________________________________   INITIAL IMPRESSION / ASSESSMENT AND PLAN / ED COURSE  Pertinent labs & imaging results that were available during my care of the patient were reviewed by me and considered in my medical decision making (see chart for details).   Patient presented with chronic lumbar back pain and left side lower extremity radicular pain. Patient physical exam findings and history were reassuring symptoms consistent with nerve irritation and inflammation. Patient reported initially very Syretta Kochel  improvement with decadron 10 mg IM and valium 2mg . Oxycodone 5-325 given. Following, patient noted significant reduction in symptoms. Patient will be prescribed prednisone taper, flexeril 5 mg and tramadol 50 mg for continued pain management. Highly encourage patient to transition to NSAIDs once taper is completed and follow up with neurosurgery spine specialist for continued care. Patient advised to return to the emergency department for symptoms that change or worsen. Patient informed of clinical course, understand medical decision-making process, and agree with plan.      If controlled substance prescribed during this visit, 12 month history viewed on the [facility] prior to issuing an initial prescription for Schedule II or III opiod. ____________________________________________   FINAL CLINICAL IMPRESSION(S) / ED DIAGNOSES  Final diagnoses:  Sciatica of left side  Chronic bilateral low back pain with left-sided sciatica       NEW MEDICATIONS STARTED DURING THIS VISIT:  New Prescriptions   CYCLOBENZAPRINE (FLEXERIL) 5 MG TABLET    Take 1 tablet (5 mg total) by mouth 3 (three) times daily as needed.   PREDNISONE (STERAPRED UNI-PAK 21 TAB) 10 MG (21) TBPK TABLET    Take 6 tablets on day 1. Take 5 tablets on day 2. Take 4 tablets on day 3. Take 3 tablets on day 4. Take 2 tablets on day 5. Take 1 tablets on day 6.   TRAMADOL (ULTRAM) 50 MG TABLET    Take 1 tablet (50 mg total) by mouth every 6 (six) hours as needed.     Note:  This document was prepared using Dragon voice recognition software and may include unintentional dictation errors.    Clois Comber, PA-C 04/17/17 1654    Emily Filbert, MD 04/17/17 [phone]

## 2017-04-17 NOTE — Discharge Instructions (Signed)
Follow-up with neurosurgery, see contact information above.

## 2018-08-10 ENCOUNTER — Ambulatory Visit (INDEPENDENT_AMBULATORY_CARE_PROVIDER_SITE_OTHER): Payer: Medicare Other | Admitting: Orthopaedic Surgery

## 2018-10-07 ENCOUNTER — Other Ambulatory Visit: Payer: Self-pay

## 2018-10-07 ENCOUNTER — Emergency Department: Payer: Medicare HMO

## 2018-10-07 ENCOUNTER — Encounter: Payer: Self-pay | Admitting: Emergency Medicine

## 2018-10-07 ENCOUNTER — Emergency Department
Admission: EM | Admit: 2018-10-07 | Discharge: 2018-10-07 | Disposition: A | Discharge status: Home / Self Care | Payer: Medicare HMO | Attending: Emergency Medicine | Admitting: Emergency Medicine

## 2018-10-07 DIAGNOSIS — Z79899 Other long term (current) drug therapy: Secondary | ICD-10-CM | POA: Diagnosis not present

## 2018-10-07 DIAGNOSIS — J45909 Unspecified asthma, uncomplicated: Secondary | ICD-10-CM | POA: Insufficient documentation

## 2018-10-07 DIAGNOSIS — R7303 Prediabetes: Secondary | ICD-10-CM | POA: Insufficient documentation

## 2018-10-07 DIAGNOSIS — I10 Essential (primary) hypertension: Secondary | ICD-10-CM | POA: Diagnosis not present

## 2018-10-07 DIAGNOSIS — Z96651 Presence of right artificial knee joint: Secondary | ICD-10-CM | POA: Insufficient documentation

## 2018-10-07 DIAGNOSIS — Z7901 Long term (current) use of anticoagulants: Secondary | ICD-10-CM | POA: Insufficient documentation

## 2018-10-07 DIAGNOSIS — M25512 Pain in left shoulder: Secondary | ICD-10-CM | POA: Insufficient documentation

## 2018-10-07 DIAGNOSIS — M19011 Primary osteoarthritis, right shoulder: Secondary | ICD-10-CM

## 2018-10-07 DIAGNOSIS — Z91040 Latex allergy status: Secondary | ICD-10-CM | POA: Diagnosis not present

## 2018-10-07 DIAGNOSIS — M19012 Primary osteoarthritis, left shoulder: Secondary | ICD-10-CM | POA: Diagnosis not present

## 2018-10-07 DIAGNOSIS — M25511 Pain in right shoulder: Secondary | ICD-10-CM | POA: Diagnosis present

## 2018-10-07 MED ORDER — NAPROXEN 500 MG PO TABS
500.0000 mg | ORAL_TABLET | Freq: Once | ORAL | Status: AC
  Administered 2018-10-07: 500 mg via ORAL
  Filled 2018-10-07: qty 1

## 2018-10-07 MED ORDER — NAPROXEN 500 MG PO TABS: 500.0000 mg | ORAL_TABLET | Freq: Two times a day (BID) | ORAL | Status: DC

## 2018-10-07 MED ORDER — TRAMADOL HCL 50 MG PO TABS: 50.0000 mg | ORAL_TABLET | Freq: Two times a day (BID) | ORAL | 0 refills | Status: DC | PRN

## 2018-10-07 NOTE — ED Triage Notes (Signed)
Bilateral shoulder pain x 1 month. Noticed first pain when lifted granchild one month ago, pain has continued

## 2018-10-07 NOTE — Discharge Instructions (Signed)
Follow discharge care instructions and follow-up with scheduled family doctor appointment next month.

## 2018-10-07 NOTE — ED Provider Notes (Signed)
[facility] Emergency Department Provider Note   ____________________________________________   First MD Initiated Contact with Patient 10/07/18 1246     (approximate)  I have reviewed the triage vital signs and the nursing notes.   HISTORY  Chief Complaint Shoulder Pain    HPI Andrea Glass is a 67 y.o. female patient presents with bilateral shoulder pain for 1 month.  Patient states has a history of a right rotator cuff repair 2 years ago.  Patient states onset of pain started when she started lifting her grandson with the right upper extremity.  Patient had pain in the right shoulder became so severe she started lifting her grandson with the left upper extremity.  Patient states the pain in the left shoulder is greater than the right shoulder at this time.  Patient the pain is consistent with rotator cuff injury she had to the right shoulder.  Patient rates the pain as a 9/10.  Patient described the pain is "achy".  No palliative measures for complaint.  Past Medical History:  Diagnosis Date  . Arthritis   . Asthma   . Depression    when mother passed away  . Dysrhythmia   . GERD (gastroesophageal reflux disease)   . History of kidney stones   . Hypertension   . Pneumonia   . Pre-diabetes   . Sleep apnea    does not use CPAP    Patient Active Problem List   Diagnosis Date Noted  . Osteoarthritis of right knee 07/30/2016  . 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 ABDOMINAL HYSTERECTOMY    . BACK SURGERY     cervical and lumbar  . ROTATOR CUFF REPAIR     right  . TOTAL KNEE ARTHROPLASTY Right 07/30/2016   Procedure: RIGHT TOTAL KNEE ARTHROPLASTY;  Surgeon: Kathryne Hitch, MD;  Location: [facility];  Service: Orthopedics;  Laterality: Right;    Prior to Admission medications   Medication Sig Start Date End Date Taking? Authorizing Provider  acetaminophen  (TYLENOL) 500 MG tablet Take 500-1,000 mg by mouth every 6 (six) hours as needed (for pain.).    [provider]  cyclobenzaprine (FLEXERIL) 5 MG tablet Take 1 tablet (5 mg total) by mouth 3 (three) times daily as needed. 04/17/17   Little, Traci M, PA-C  fluticasone (FLONASE) 50 MCG/ACT nasal spray Place 1-2 sprays into both nostrils daily as needed for allergies.    [provider]  hydrochlorothiazide (HYDRODIURIL) 25 MG tablet Take 25 mg by mouth daily. 07/02/16   [provider]  HYDROcodone-acetaminophen (NORCO/VICODIN) 5-325 MG tablet Take 1-2 tablets by mouth 2 (two) times daily as needed for moderate pain. 09/13/16   Kathryne Hitch, MD  ibuprofen (ADVIL,MOTRIN) 800 MG tablet Take 1 tablet (800 mg total) by mouth every 8 (eight) hours as needed. 09/13/16   Kathryne Hitch, MD  naproxen (NAPROSYN) 500 MG tablet Take 1 tablet (500 mg total) by mouth 2 (two) times daily with a meal. 10/07/18   Joni Reining, PA-C  oxyCODONE-acetaminophen (ROXICET) 5-325 MG tablet Take 1-2 tablets by mouth every 6 (six) hours as needed. 08/26/16   Kathryne Hitch, MD  oxymetazoline (AFRIN) 0.05 % nasal spray Place 1 spray into both nostrils 2 (two) times daily as needed for congestion.    [provider]  Phenyleph-CPM-DM-APAP (ALKA-SELTZER PLUS COLD & COUGH) 02-16-09-325 MG CAPS Take 1-2 tablets by mouth every  6 (six) hours as needed (for cold symptoms.).    [provider]  Phenyleph-Diphenhyd-DM-APAP [facility](THERAFLU SEVERE COLD & COUGH) Packet MISC Take 1 packet by mouth at bedtime as needed (for cold symptoms.).    [provider]  predniSONE (DELTASONE) 10 MG tablet Take 1 tablet (10 mg total) by mouth 2 (two) times daily with a meal. 06/15/16   Menshew, Charlesetta IvoryJenise V Bacon, PA-C  predniSONE (STERAPRED UNI-PAK 21 TAB) 10 MG (21) TBPK tablet Take 6 tablets on day 1. Take 5 tablets on day 2. Take 4 tablets on day 3. Take 3 tablets on day 4. Take 2  tablets on day 5. Take 1 tablets on day 6. 04/17/17   Little, Traci M, PA-C  ranitidine (ZANTAC) 150 MG tablet Take 150 mg by mouth 2 (two) times daily. 07/02/16   [provider]  rivaroxaban (XARELTO) 10 MG TABS tablet Take 1 tablet (10 mg total) by mouth daily with breakfast. 08/02/16   Kathryne HitchBlackman, Christopher Y, MD  sertraline (ZOLOFT) 50 MG tablet Take 50 mg by mouth daily as needed. For mood enhancer. 07/18/16   [provider]  tiZANidine (ZANAFLEX) 4 MG tablet Take 1 tablet (4 mg total) by mouth every 8 (eight) hours as needed for muscle spasms. 09/13/16   Kathryne HitchBlackman, Christopher Y, MD  traMADol (ULTRAM) 50 MG tablet Take 1 tablet (50 mg total) by mouth every 6 (six) hours as needed. 04/17/17   Little, Traci M, PA-C  traMADol (ULTRAM) 50 MG tablet Take 1 tablet (50 mg total) by mouth every 12 (twelve) hours as needed. 10/07/18   Joni ReiningSmith, Sanav Remer K, PA-C  VENTOLIN HFA 108 (90 Base) MCG/ACT inhaler Inhale 1-2 puffs into the lungs every 6 (six) hours as needed for shortness of breath or wheezing. 07/18/16   [provider]    Allergies Latex  No family history on file.  Social History Social History   Tobacco Use  . Smoking status: Never Smoker  . Smokeless tobacco: Never Used  Substance Use Topics  . Alcohol use: No  . Drug use: No    Review of Systems Constitutional: No fever/chills Eyes: No visual changes. ENT: No sore throat. Cardiovascular: Denies chest pain. Respiratory: Denies shortness of breath. Gastrointestinal: No abdominal pain.  No nausea, no vomiting.  No diarrhea.  No constipation. Genitourinary: Negative for dysuria. Musculoskeletal: Bilateral shoulder pain. Skin: Negative for rash. Neurological: Negative for headaches, focal weakness or numbness. Psychiatric:Depression. Endocrine:Hypertension and prediabetic. Allergic/Immunilogical: Latex. ____________________________________________   PHYSICAL EXAM:  VITAL SIGNS: ED Triage Vitals    Enc Vitals Group     BP 10/07/18 1242 138/71     Pulse Rate 10/07/18 1242 73     Resp 10/07/18 1242 18     Temp 10/07/18 1242 (!) 97.4 F (36.3 C)     Temp Source 10/07/18 1242 Oral     SpO2 10/07/18 1242 98 %     Weight 10/07/18 1243 295 lb (133.8 kg)     Height 10/07/18 1243 5\' 5"  (1.651 m)     Head Circumference --      Peak Flow --      Pain Score 10/07/18 1243 9     Pain Loc --      Pain Edu? --      Excl. in GC? --     Constitutional: Alert and oriented. Well appearing and in no acute distress. Neck: No cervical spine tenderness to palpation. Hematological/Lymphatic/Immunilogical: No cervical lymphadenopathy. Cardiovascular: Normal rate, regular rhythm. Grossly normal heart  sounds.  Good peripheral circulation. Respiratory: Normal respiratory effort.  No retractions. Lungs CTAB. Gastrointestinal: Soft and nontender. No distention. No abdominal bruits. No CVA tenderness. Musculoskeletal: No obvious deformity to the right upper extremities.  Patient has decreased range of motion with abduction overhead reaching.  Patient is moderate guarding bilateral superior posterior aspect of the shoulder. Neurologic:  Normal speech and language. No gross focal neurologic deficits are appreciated. No gait instability. Skin:  Skin is warm, dry and intact. No rash noted. Psychiatric: Mood and affect are normal. Speech and behavior are normal.  ____________________________________________   LABS (all labs ordered are listed, but only abnormal results are displayed)  Labs Reviewed - No data to display ____________________________________________  EKG   ____________________________________________  RADIOLOGY  ED MD interpretation:    Official radiology report(s): Dg Shoulder Right  Result Date: 10/07/2018 CLINICAL DATA:  No injury, pain for over 1 month both shoulders. Decreased ROM bilaterally. Previous rotator cuff repair on right shoulder in 1990s. No injury, pain for over 1  month both shoulders. Decreased ROM bilaterally. Previous rotator cu.*comment was truncated*Pain and decreased range of motion with overhead reaching and abduction. History of rotator cuff repair. History of rotator cuff repair EXAM: RIGHT SHOULDER - 2+ VIEW COMPARISON:  None. FINDINGS: Glenohumeral joint is intact. No evidence of scapular fracture or humeral fracture. The acromioclavicular joint is intact. Mild degenerate spurring of the acromioclavicular joint. IMPRESSION: No acute osseous abnormality. Electronically Signed   By: Genevive BiStewart  Edmunds M.D.   On: 10/07/2018 14:13   Dg Shoulder Left  Result Date: 10/07/2018 CLINICAL DATA:  Shoulder pain, decreased range of motion EXAM: LEFT SHOULDER - 2+ VIEW COMPARISON:  None. FINDINGS: AC joint degenerative change noted. No acute osseous finding, fracture, subluxation or dislocation. Limited exam because of body habitus and positioning. IMPRESSION: AC joint degenerative change. No other acute finding by plain radiography Electronically Signed   By: Judie PetitM.  Shick M.D.   On: 10/07/2018 14:12    ____________________________________________   PROCEDURES  Procedure(s) performed: None  Procedures  Critical Care performed: No  ____________________________________________   INITIAL IMPRESSION / ASSESSMENT AND PLAN / ED COURSE  As part of my medical decision making, I reviewed the following data within the electronic MEDICAL RECORD NUMBER    Patient presents with 1 month of bilateral shoulder pain.  Discussed x-ray findings with patient consistent with arthritis.  Patient given discharge care instructions.  Patient advised take medication directed follow-up PCP per schedule appointment next month.  Return to ED if condition worsens.   ____________________________________________   FINAL CLINICAL IMPRESSION(S) / ED DIAGNOSES  Final diagnoses:  Primary osteoarthritis of both shoulders     ED Discharge Orders         Ordered    naproxen (NAPROSYN)  500 MG tablet  2 times daily with meals     10/07/18 1424    traMADol (ULTRAM) 50 MG tablet  Every 12 hours PRN     10/07/18 1424           Note:  This document was prepared using Dragon voice recognition software and may include unintentional dictation errors.    Joni ReiningSmith, Jaidyn Usery K, PA-C 10/07/18 1426    Sharyn CreamerQuale, Mark, MD 10/07/18 [id number]

## 2019-02-04 ENCOUNTER — Emergency Department
Admission: EM | Admit: 2019-02-04 | Discharge: 2019-02-04 | Disposition: A | Discharge status: Home / Self Care | Payer: Medicare HMO | Attending: Emergency Medicine | Admitting: Emergency Medicine

## 2019-02-04 ENCOUNTER — Encounter: Payer: Self-pay | Admitting: Emergency Medicine

## 2019-02-04 ENCOUNTER — Emergency Department: Payer: Medicare HMO

## 2019-02-04 ENCOUNTER — Other Ambulatory Visit: Payer: Self-pay

## 2019-02-04 DIAGNOSIS — Z7901 Long term (current) use of anticoagulants: Secondary | ICD-10-CM | POA: Insufficient documentation

## 2019-02-04 DIAGNOSIS — M5430 Sciatica, unspecified side: Secondary | ICD-10-CM | POA: Insufficient documentation

## 2019-02-04 DIAGNOSIS — Z79899 Other long term (current) drug therapy: Secondary | ICD-10-CM | POA: Insufficient documentation

## 2019-02-04 DIAGNOSIS — J45909 Unspecified asthma, uncomplicated: Secondary | ICD-10-CM | POA: Insufficient documentation

## 2019-02-04 DIAGNOSIS — M545 Low back pain: Secondary | ICD-10-CM | POA: Insufficient documentation

## 2019-02-04 DIAGNOSIS — R2242 Localized swelling, mass and lump, left lower limb: Secondary | ICD-10-CM | POA: Diagnosis not present

## 2019-02-04 DIAGNOSIS — I10 Essential (primary) hypertension: Secondary | ICD-10-CM | POA: Insufficient documentation

## 2019-02-04 DIAGNOSIS — M5442 Lumbago with sciatica, left side: Secondary | ICD-10-CM

## 2019-02-04 DIAGNOSIS — Z96651 Presence of right artificial knee joint: Secondary | ICD-10-CM | POA: Insufficient documentation

## 2019-02-04 MED ORDER — HYDROCODONE-ACETAMINOPHEN 5-325 MG PO TABS
1.0000 | ORAL_TABLET | Freq: Three times a day (TID) | ORAL | 0 refills | Status: AC | PRN
Start: 2019-02-04 — End: 2019-02-07

## 2019-02-04 MED ORDER — KETOROLAC TROMETHAMINE 30 MG/ML IJ SOLN
30.0000 mg | Freq: Once | INTRAMUSCULAR | Status: AC
  Administered 2019-02-04: 12:00:00 30 mg via INTRAMUSCULAR
  Filled 2019-02-04: qty 1

## 2019-02-04 MED ORDER — LIDOCAINE 5 % EX PTCH: 1.0000 | MEDICATED_PATCH | CUTANEOUS | 0 refills | Status: DC

## 2019-02-04 MED ORDER — LIDOCAINE 5 % EX PTCH
1.0000 | MEDICATED_PATCH | CUTANEOUS | Status: DC
  Administered 2019-02-04: 12:00:00 1 via TRANSDERMAL
  Filled 2019-02-04: qty 1

## 2019-02-04 MED ORDER — BACLOFEN 10 MG PO TABS: 5.0000 mg | ORAL_TABLET | Freq: Every day | ORAL | 0 refills | Status: AC

## 2019-02-04 NOTE — ED Notes (Signed)
Pt states left sided low back pain that radiates into left leg.  Pt reports previous low back surgery.  Pt reports symptoms for last 2 weeks.  Pt states was seen "at the doctor in [place]", received Tramadol for pain and states it is not helping.

## 2019-02-04 NOTE — ED Notes (Signed)
Pt back from ultrasound.

## 2019-02-04 NOTE — ED Notes (Signed)
First Nurse Note: Pt to ED for recurrent muscle spasms.

## 2019-02-04 NOTE — ED Provider Notes (Signed)
[facility] Emergency Department Provider Note  ____________________________________________  Time seen: Approximately 11:36 AM  I have reviewed the triage vital signs and the nursing notes.   HISTORY  Chief Complaint Back Pain    HPI Andrea Glass is a 68 y.o. female presents emergency department for evaluation of low back pain that radiates into left leg worsening for 2 weeks.  Patient states that leg is aching.  Pain is worse in her thigh but radiates into her lower leg as well.  Pain started after she was trying to move a heavy carpet.  She states she had x-rays of her back completed about 1 month ago.  Patient states that back pain is fairly chronic since her back surgery several years ago.  She was seen for similar interim and received tramadol, which does not help.  She has a referral for a specialist at [facility] and had to keep appointment scheduled but had to miss the first due to having influenza and had to miss the second due to the COVID-19 pandemic.  No bowel or bladder dysfunction or saddle anesthesias. No history of DVT.  No fever, abdominal pain, urinary symptoms.  Past Medical History:  Diagnosis Date  . Arthritis   . Asthma   . Depression    when mother passed away  . Dysrhythmia   . GERD (gastroesophageal reflux disease)   . History of kidney stones   . Hypertension   . Pneumonia   . Pre-diabetes   . Sleep apnea    does not use CPAP    Patient Active Problem List   Diagnosis Date Noted  . Osteoarthritis of right knee 07/30/2016  . 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 ABDOMINAL HYSTERECTOMY    . BACK SURGERY     cervical and lumbar  . ROTATOR CUFF REPAIR     right  . TOTAL KNEE ARTHROPLASTY Right 07/30/2016   Procedure: RIGHT TOTAL KNEE ARTHROPLASTY;  Surgeon: Kathryne Hitch, MD;  Location: [facility];  Service: Orthopedics;  Laterality: Right;    Prior  to Admission medications   Medication Sig Start Date End Date Taking? Authorizing Provider  acetaminophen (TYLENOL) 500 MG tablet Take 500-1,000 mg by mouth every 6 (six) hours as needed (for pain.).    [provider]  baclofen (LIORESAL) 10 MG tablet Take 0.5 tablets (5 mg total) by mouth daily for 7 days. 02/04/19 02/11/19  Enid Derry, PA-C  cyclobenzaprine (FLEXERIL) 5 MG tablet Take 1 tablet (5 mg total) by mouth 3 (three) times daily as needed. Patient not taking: Reported on 02/04/2019 04/17/17   Little, Traci M, PA-C  fluticasone (FLONASE) 50 MCG/ACT nasal spray Place 1-2 sprays into both nostrils daily as needed for allergies.    [provider]  hydrochlorothiazide (HYDRODIURIL) 25 MG tablet Take 25 mg by mouth daily. 07/02/16   [provider]  HYDROcodone-acetaminophen (NORCO/VICODIN) 5-325 MG tablet Take 1 tablet by mouth every 8 (eight) hours as needed for up to 3 days for moderate pain. 02/04/19 02/07/19  Enid Derry, PA-C  ibuprofen (ADVIL,MOTRIN) 800 MG tablet Take 1 tablet (800 mg total) by mouth every 8 (eight) hours as needed. Patient not taking: Reported on 02/04/2019 09/13/16   Kathryne Hitch, MD  lidocaine (LIDODERM) 5 % Place 1 patch onto the skin daily. Remove & Discard patch within 12 hours or as directed by MD 02/04/19   Enid Derry, PA-C  naproxen (NAPROSYN) 500 MG tablet Take 1 tablet (500 mg total) by mouth 2 (two) times daily with a meal. Patient not taking: Reported on 02/04/2019 10/07/18   Joni Reining, PA-C  oxyCODONE-acetaminophen (ROXICET) 5-325 MG tablet Take 1-2 tablets by mouth every 6 (six) hours as needed. Patient not taking: Reported on 02/04/2019 08/26/16   Kathryne Hitch, MD  oxymetazoline (AFRIN) 0.05 % nasal spray Place 1 spray into both nostrils 2 (two) times daily as needed for congestion.    [provider]  Phenyleph-CPM-DM-APAP (ALKA-SELTZER PLUS COLD & COUGH) 02-16-09-325 MG CAPS Take 1-2 tablets  by mouth every 6 (six) hours as needed (for cold symptoms.).    [provider]  Phenyleph-Diphenhyd-DM-APAP [facility] SEVERE COLD & COUGH) Packet MISC Take 1 packet by mouth at bedtime as needed (for cold symptoms.).    [provider]  predniSONE (DELTASONE) 10 MG tablet Take 1 tablet (10 mg total) by mouth 2 (two) times daily with a meal. Patient not taking: Reported on 02/04/2019 06/15/16   Menshew, Charlesetta Ivory, PA-C  predniSONE (STERAPRED UNI-PAK 21 TAB) 10 MG (21) TBPK tablet Take 6 tablets on day 1. Take 5 tablets on day 2. Take 4 tablets on day 3. Take 3 tablets on day 4. Take 2 tablets on day 5. Take 1 tablets on day 6. Patient not taking: Reported on 02/04/2019 04/17/17   Little, Traci M, PA-C  ranitidine (ZANTAC) 150 MG tablet Take 150 mg by mouth 2 (two) times daily. 07/02/16   [provider]  rivaroxaban (XARELTO) 10 MG TABS tablet Take 1 tablet (10 mg total) by mouth daily with breakfast. 08/02/16   Kathryne Hitch, MD  sertraline (ZOLOFT) 50 MG tablet Take 50 mg by mouth daily as needed. For mood enhancer. 07/18/16   [provider]  tiZANidine (ZANAFLEX) 4 MG tablet Take 1 tablet (4 mg total) by mouth every 8 (eight) hours as needed for muscle spasms. Patient not taking: Reported on 02/04/2019 09/13/16   Kathryne Hitch, MD  traMADol (ULTRAM) 50 MG tablet Take 1 tablet (50 mg total) by mouth every 6 (six) hours as needed. Patient not taking: Reported on 02/04/2019 04/17/17   Little, Traci M, PA-C  traMADol (ULTRAM) 50 MG tablet Take 1 tablet (50 mg total) by mouth every 12 (twelve) hours as needed. Patient not taking: Reported on 02/04/2019 10/07/18   Joni Reining, PA-C  VENTOLIN HFA 108 (90 Base) MCG/ACT inhaler Inhale 1-2 puffs into the lungs every 6 (six) hours as needed for shortness of breath or wheezing. 07/18/16   [provider]    Allergies Latex  No family history on file.  Social History Social History    Tobacco Use  . Smoking status: Never Smoker  . Smokeless tobacco: Never Used  Substance Use Topics  . Alcohol use: No  . Drug use: No     Review of Systems  Constitutional: No fever/chills Cardiovascular: No chest pain. Respiratory: No SOB. Gastrointestinal: No abdominal pain.  No nausea, no vomiting.  Musculoskeletal: Positive for back and leg pain.  Skin: Negative for rash, abrasions, lacerations, ecchymosis. Neurological: Negative for headaches, numbness or tingling   ____________________________________________   PHYSICAL EXAM:  VITAL SIGNS: ED Triage Vitals  Enc Vitals Group     BP 02/04/19 0956 115/65     Pulse Rate 02/04/19 0956 68     Resp 02/04/19 0956 20     Temp 02/04/19 0956 98.2 F (36.8 C)  Temp Source 02/04/19 0956 Oral     SpO2 02/04/19 0956 100 %     Weight 02/04/19 0958 292 lb (132.5 kg)     Height 02/04/19 0958 5\' 5"  (1.651 m)     Head Circumference --      Peak Flow --      Pain Score 02/04/19 0953 8     Pain Loc --      Pain Edu? --      Excl. in GC? --      Constitutional: Alert and oriented. Well appearing and in no acute distress. Eyes: Conjunctivae are normal. PERRL. EOMI. Head: Atraumatic. ENT:      Ears:      Nose: No congestion/rhinnorhea.      Mouth/Throat: Mucous membranes are moist.  Neck: No stridor.  Cardiovascular: Normal rate, regular rhythm.  Good peripheral circulation. Respiratory: Normal respiratory effort without tachypnea or retractions. Lungs CTAB. Good air entry to the bases with no decreased or absent breath sounds. Gastrointestinal: Bowel sounds 4 quadrants. Soft and nontender to palpation. No guarding or rigidity. No palpable masses. No distention.  Musculoskeletal: Full range of motion to all extremities. No gross deformities appreciated.  Tenderness to palpation to left SI joint.  Full range of motion of bilateral hips and knees.  Strength equal in bilateral lower extremities.  Weightbearing, normal  gait. Neurologic:  Normal speech and language. No gross focal neurologic deficits are appreciated.  Skin:  Skin is warm, dry and intact. No rash noted. Psychiatric: Mood and affect are normal. Speech and behavior are normal. Patient exhibits appropriate insight and judgement.   ____________________________________________   LABS (all labs ordered are listed, but only abnormal results are displayed)  Labs Reviewed - No data to display ____________________________________________  EKG   ____________________________________________  RADIOLOGY   US Venous Img Lower Unilateral Left  Result Date: 02/04/2019 CLINICAL DATA:  Left leg swelling EXAM: LEFT LOWER EXTREMITY VENOUS DOPPLER ULTRASOUND TECHNIQUE: Gray-scale sonography with graded compression, as well as color Doppler and duplex ultrasound were performed to evaluate the lower extremity deep venous systems from the level of the common femoral vein and including the common femoral, femoral, profunda femoral, popliteal and calf veins including the posterior tibial, peroneal and gastrocnemius veins when visible. The superficial great saphenous vein was also interrogated. Spectral Doppler was utilized to evaluate flow at rest and with distal augmentation maneuvers in the common femoral, femoral and popliteal veins. COMPARISON:  None. FINDINGS: Contralateral Common Femoral Vein: Respiratory phasicity is normal and symmetric with the symptomatic side. No evidence of thrombus. Normal compressibility. Common Femoral Vein: No evidence of thrombus. Normal compressibility, respiratory phasicity and response to augmentation. Saphenofemoral Junction: No evidence of thrombus. Normal compressibility and flow on color Doppler imaging. Profunda Femoral Vein: No evidence of thrombus. Normal compressibility and flow on color Doppler imaging. Femoral Vein: No evidence of thrombus. Normal compressibility, respiratory phasicity and response to augmentation.  Popliteal Vein: No evidence of thrombus. Normal compressibility, respiratory phasicity and response to augmentation. Calf Veins: No evidence of thrombus. Normal compressibility and flow on color Doppler imaging. Superficial Great Saphenous Vein: No evidence of thrombus. Normal compressibility. Venous Reflux:  None. Other Findings:  None. IMPRESSION: No evidence of deep venous thrombosis. Electronically Signed   By: Elige Ko   On: 02/04/2019 11:39    ____________________________________________    PROCEDURES  Procedure(s) performed:    Procedures    Medications  lidocaine (LIDODERM) 5 % 1 patch (1 patch Transdermal Patch Applied 02/04/19 1156)  ketorolac (TORADOL) 30 MG/ML injection 30 mg (30 mg Intramuscular Given 02/04/19 1157)     ____________________________________________   INITIAL IMPRESSION / ASSESSMENT AND PLAN / ED COURSE  Pertinent labs & imaging results that were available during my care of the patient were reviewed by me and considered in my medical decision making (see chart for details).  Review of the [place] CSRS was performed in accordance of the [facility] prior to dispensing any controlled drugs.     Patient's diagnosis is consistent with sciatica.  Vital signs and exam are reassuring.  Ultrasound negative for DVT.  Patient will be discharged home with prescriptions for baclofen, Lidoderm patch, a short course of Vicodin. Patient is to follow up with primary care as directed.  Patient states that referrals have already been given to [facility].  Patient is given ED precautions to return to the ED for any worsening or new symptoms.     ____________________________________________  FINAL CLINICAL IMPRESSION(S) / ED DIAGNOSES  Final diagnoses:  Acute left-sided low back pain with left-sided sciatica      NEW MEDICATIONS STARTED DURING THIS VISIT:  ED Discharge Orders         Ordered    baclofen (LIORESAL) 10 MG tablet  Daily     02/04/19 1157     HYDROcodone-acetaminophen (NORCO/VICODIN) 5-325 MG tablet  Every 8 hours PRN     02/04/19 1157    lidocaine (LIDODERM) 5 %  Every 24 hours     02/04/19 1157              This chart was dictated using voice recognition software/Dragon. Despite best efforts to proofread, errors can occur which can change the meaning. Any change was purely unintentional.    Enid DerryWagner, Ever Halberg, PA-C 02/04/19 1609    Sharyn CreamerQuale, Mark, MD 02/04/19 [phone]

## 2019-02-04 NOTE — ED Triage Notes (Signed)
Pt to ED via POV, pt states that she is having muscles spasms on the left side. Pt states that this is a recurrent issue. Pt had back surgery 10 years ago and states that this has been happening since then.

## 2019-02-04 NOTE — ED Notes (Signed)
Patient transported to Ultrasound 

## 2019-04-13 ENCOUNTER — Encounter: Payer: Self-pay | Admitting: Emergency Medicine

## 2019-04-13 ENCOUNTER — Other Ambulatory Visit: Payer: Self-pay

## 2019-04-13 ENCOUNTER — Emergency Department
Admission: EM | Admit: 2019-04-13 | Discharge: 2019-04-13 | Disposition: A | Discharge status: Home / Self Care | Payer: Medicare HMO | Attending: Emergency Medicine | Admitting: Emergency Medicine

## 2019-04-13 DIAGNOSIS — J0101 Acute recurrent maxillary sinusitis: Secondary | ICD-10-CM | POA: Diagnosis not present

## 2019-04-13 DIAGNOSIS — Z79899 Other long term (current) drug therapy: Secondary | ICD-10-CM | POA: Diagnosis not present

## 2019-04-13 DIAGNOSIS — J45909 Unspecified asthma, uncomplicated: Secondary | ICD-10-CM | POA: Insufficient documentation

## 2019-04-13 DIAGNOSIS — I10 Essential (primary) hypertension: Secondary | ICD-10-CM | POA: Diagnosis not present

## 2019-04-13 DIAGNOSIS — Z91040 Latex allergy status: Secondary | ICD-10-CM | POA: Diagnosis not present

## 2019-04-13 DIAGNOSIS — Z7901 Long term (current) use of anticoagulants: Secondary | ICD-10-CM | POA: Diagnosis not present

## 2019-04-13 DIAGNOSIS — G501 Atypical facial pain: Secondary | ICD-10-CM | POA: Diagnosis present

## 2019-04-13 MED ORDER — AMOXICILLIN-POT CLAVULANATE 875-125 MG PO TABS
1.0000 | ORAL_TABLET | Freq: Two times a day (BID) | ORAL | 0 refills | Status: AC
Start: 2019-04-13 — End: 2019-04-23

## 2019-04-13 MED ORDER — AMOXICILLIN-POT CLAVULANATE 875-125 MG PO TABS
1.0000 | ORAL_TABLET | Freq: Once | ORAL | Status: AC
  Administered 2019-04-13: 16:00:00 1 via ORAL
  Filled 2019-04-13: qty 1

## 2019-04-13 MED ORDER — KETOROLAC TROMETHAMINE 30 MG/ML IJ SOLN
30.0000 mg | Freq: Once | INTRAMUSCULAR | Status: AC
  Administered 2019-04-13: 16:00:00 30 mg via INTRAMUSCULAR
  Filled 2019-04-13: qty 1

## 2019-04-13 NOTE — ED Triage Notes (Signed)
Pt via pov from home with sinus pain and congestion x 1 week. Pt states she has used nose spray with no relief. Pt alert & oriented with NAD noted.

## 2019-04-13 NOTE — ED Notes (Signed)
See triage note   Presents with possible sinus infection  States has pressure behind eyes and nasal congestion

## 2019-04-13 NOTE — ED Provider Notes (Signed)
[facility] Emergency Department Provider Note  ____________________________________________  Time seen: Approximately 3:29 PM  I have reviewed the triage vital signs and the nursing notes.   HISTORY  Chief Complaint Facial Pain    HPI Andrea Glass is a 68 y.o. female that presents to the emergency department for evaluation of facial pain, sinus congestion, postnasal drip, light sensitivity, watering eyes for 1 week.  Tenderness started in her forehead and is now in bilateral cheeks.  Patient gets a sinus infection every year.  She is used nose sprays without relief.  No fever, cough, shortness of breath, chest pain.   Past Medical History:  Diagnosis Date  . Arthritis   . Asthma   . Depression    when mother passed away  . Dysrhythmia   . GERD (gastroesophageal reflux disease)   . History of kidney stones   . Hypertension   . Pneumonia   . Pre-diabetes   . Sleep apnea    does not use CPAP    Patient Active Problem List   Diagnosis Date Noted  . Osteoarthritis of right knee 07/30/2016  . 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 ABDOMINAL HYSTERECTOMY    . BACK SURGERY     cervical and lumbar  . ROTATOR CUFF REPAIR     right  . TOTAL KNEE ARTHROPLASTY Right 07/30/2016   Procedure: RIGHT TOTAL KNEE ARTHROPLASTY;  Surgeon: Kathryne Hitchhristopher Y Blackman, MD;  Location: [facility];  Service: Orthopedics;  Laterality: Right;    Prior to Admission medications   Medication Sig Start Date End Date Taking? Authorizing Provider  acetaminophen (TYLENOL) 500 MG tablet Take 500-1,000 mg by mouth every 6 (six) hours as needed (for pain.).    [provider]  amoxicillin-clavulanate (AUGMENTIN) 875-125 MG tablet Take 1 tablet by mouth 2 (two) times daily for 10 days. 04/13/19 04/23/19  Enid DerryWagner, Isabeau Mccalla, PA-C  fluticasone (FLONASE) 50 MCG/ACT nasal spray Place 1-2 sprays into both nostrils  daily as needed for allergies.    [provider]  hydrochlorothiazide (HYDRODIURIL) 25 MG tablet Take 25 mg by mouth daily. 07/02/16   [provider]  oxymetazoline (AFRIN) 0.05 % nasal spray Place 1 spray into both nostrils 2 (two) times daily as needed for congestion.    [provider]  ranitidine (ZANTAC) 150 MG tablet Take 150 mg by mouth 2 (two) times daily. 07/02/16   [provider]  rivaroxaban (XARELTO) 10 MG TABS tablet Take 1 tablet (10 mg total) by mouth daily with breakfast. 08/02/16   Kathryne HitchBlackman, Christopher Y, MD  sertraline (ZOLOFT) 50 MG tablet Take 50 mg by mouth daily as needed. For mood enhancer. 07/18/16   [provider]  VENTOLIN HFA 108 (90 Base) MCG/ACT inhaler Inhale 1-2 puffs into the lungs every 6 (six) hours as needed for shortness of breath or wheezing. 07/18/16   [provider]    Allergies Latex  History reviewed. No pertinent family history.  Social History Social History   Tobacco Use  . Smoking status: Never Smoker  . Smokeless tobacco: Never Used  Substance Use Topics  . Alcohol use: No  . Drug use: No     Review of Systems  Constitutional: No fever/chills Eyes: No visual changes. No discharge. ENT: Positive for congestion and rhinorrhea.  Positive for facial pain. Cardiovascular: No chest pain. Respiratory: Negative for cough. No SOB. Gastrointestinal: No abdominal pain.  No nausea, no  vomiting.  No diarrhea.  No constipation. Musculoskeletal: Negative for musculoskeletal pain. Skin: Negative for rash, abrasions, lacerations, ecchymosis. Neurological: Negative for headaches.   ____________________________________________   PHYSICAL EXAM:  VITAL SIGNS: ED Triage Vitals  Enc Vitals Group     BP 04/13/19 1323 (!) 154/88     Pulse Rate 04/13/19 1323 (!) 59     Resp 04/13/19 1323 18     Temp 04/13/19 1323 98.9 F (37.2 C)     Temp Source 04/13/19 1323 Oral     SpO2 04/13/19 1323 (!)  59 %     Weight 04/13/19 1324 293 lb (132.9 kg)     Height 04/13/19 1324 5\' 5"  (1.651 m)     Head Circumference --      Peak Flow --      Pain Score 04/13/19 1324 8     Pain Loc --      Pain Edu? --      Excl. in GC? --      Constitutional: Alert and oriented. Well appearing and in no acute distress. Eyes: Conjunctivae are normal. PERRL. EOMI. No discharge. Head: Atraumatic. ENT: Frontal and maxillary sinus tenderness.      Ears: Tympanic membranes pearly gray with good landmarks. No discharge.      Nose: Mild congestion/rhinnorhea.      Mouth/Throat: Mucous membranes are moist. Oropharynx non-erythematous. Tonsils not enlarged. No exudates. Uvula midline. Neck: No stridor.   Hematological/Lymphatic/Immunilogical: No cervical lymphadenopathy. Cardiovascular: Normal rate, regular rhythm.  Good peripheral circulation. Respiratory: Normal respiratory effort without tachypnea or retractions. Lungs CTAB. Good air entry to the bases with no decreased or absent breath sounds. Gastrointestinal: Bowel sounds 4 quadrants. Soft and nontender to palpation. No guarding or rigidity. No palpable masses. No distention. Musculoskeletal: Full range of motion to all extremities. No gross deformities appreciated. Neurologic:  Normal speech and language. No gross focal neurologic deficits are appreciated.  Skin:  Skin is warm, dry and intact. No rash noted. Psychiatric: Mood and affect are normal. Speech and behavior are normal. Patient exhibits appropriate insight and judgement.   ____________________________________________   LABS (all labs ordered are listed, but only abnormal results are displayed)  Labs Reviewed - No data to display ____________________________________________  EKG   ____________________________________________  RADIOLOGY   No results found.  ____________________________________________    PROCEDURES  Procedure(s) performed:    Procedures    Medications   ketorolac (TORADOL) 30 MG/ML injection 30 mg (has no administration in time range)  amoxicillin-clavulanate (AUGMENTIN) 875-125 MG per tablet 1 tablet (has no administration in time range)     ____________________________________________   INITIAL IMPRESSION / ASSESSMENT AND PLAN / ED COURSE  Pertinent labs & imaging results that were available during my care of the patient were reviewed by me and considered in my medical decision making (see chart for details).  Review of the [place] CSRS was performed in accordance of the [facility] prior to dispensing any controlled drugs.   Patient's diagnosis is consistent with sinus infection. Vital signs and exam are reassuring.  Toradol was given for headache and facial pain.  She was given a dose of Augmentin in the emergency department.  Patient appears well and is staying well hydrated.  Patient feels comfortable going home. Patient will be discharged home with prescriptions for Augmentin. Patient is to follow up with primary care as needed or otherwise directed. Patient is given ED precautions to return to the ED for any worsening or new symptoms.  Ellin MayhewIrene S Glass  was evaluated in Emergency Department on 04/13/2019 for the symptoms described in the history of present illness. She was evaluated in the context of the global COVID-19 pandemic, which necessitated consideration that the patient might be at risk for infection with the SARS-CoV-2 virus that causes COVID-19. Institutional protocols and algorithms that pertain to the evaluation of patients at risk for COVID-19 are in a state of rapid change based on information released by regulatory bodies including the CDC and federal and state organizations. These policies and algorithms were followed during the patient's care in the ED.   ____________________________________________  FINAL CLINICAL IMPRESSION(S) / ED DIAGNOSES  Final diagnoses:  Acute recurrent maxillary sinusitis      NEW MEDICATIONS  STARTED DURING THIS VISIT:  ED Discharge Orders         Ordered    amoxicillin-clavulanate (AUGMENTIN) 875-125 MG tablet  2 times daily     04/13/19 1520              This chart was dictated using voice recognition software/Dragon. Despite best efforts to proofread, errors can occur which can change the meaning. Any change was purely unintentional.    Laban Emperor, PA-C 04/13/19 1601    Delman Kitten, MD 04/13/19 [phone]

## 2019-04-24 ENCOUNTER — Emergency Department: Payer: Medicare HMO

## 2019-04-24 ENCOUNTER — Other Ambulatory Visit: Payer: Self-pay

## 2019-04-24 ENCOUNTER — Emergency Department
Admission: EM | Admit: 2019-04-24 | Discharge: 2019-04-25 | Disposition: A | Discharge status: Home / Self Care | Payer: Medicare HMO | Attending: Emergency Medicine | Admitting: Emergency Medicine

## 2019-04-24 ENCOUNTER — Encounter: Payer: Self-pay | Admitting: Emergency Medicine

## 2019-04-24 DIAGNOSIS — E860 Dehydration: Secondary | ICD-10-CM | POA: Insufficient documentation

## 2019-04-24 DIAGNOSIS — Z91040 Latex allergy status: Secondary | ICD-10-CM | POA: Diagnosis not present

## 2019-04-24 DIAGNOSIS — J45909 Unspecified asthma, uncomplicated: Secondary | ICD-10-CM | POA: Diagnosis not present

## 2019-04-24 DIAGNOSIS — Z96651 Presence of right artificial knee joint: Secondary | ICD-10-CM | POA: Diagnosis not present

## 2019-04-24 DIAGNOSIS — I10 Essential (primary) hypertension: Secondary | ICD-10-CM | POA: Insufficient documentation

## 2019-04-24 DIAGNOSIS — Z20828 Contact with and (suspected) exposure to other viral communicable diseases: Secondary | ICD-10-CM | POA: Diagnosis not present

## 2019-04-24 DIAGNOSIS — R531 Weakness: Secondary | ICD-10-CM

## 2019-04-24 DIAGNOSIS — E876 Hypokalemia: Secondary | ICD-10-CM | POA: Insufficient documentation

## 2019-04-24 DIAGNOSIS — Z79899 Other long term (current) drug therapy: Secondary | ICD-10-CM | POA: Insufficient documentation

## 2019-04-24 DIAGNOSIS — Z7901 Long term (current) use of anticoagulants: Secondary | ICD-10-CM | POA: Diagnosis not present

## 2019-04-24 DIAGNOSIS — R0602 Shortness of breath: Secondary | ICD-10-CM | POA: Diagnosis not present

## 2019-04-24 LAB — CBC WITH DIFFERENTIAL/PLATELET
Abs Immature Granulocytes: 0.07 10*3/uL (ref 0.00–0.07)
Basophils Absolute: 0 10*3/uL (ref 0.0–0.1)
Basophils Relative: 0 %
Eosinophils Absolute: 0.1 10*3/uL (ref 0.0–0.5)
Eosinophils Relative: 1 %
HCT: 41.1 % (ref 36.0–46.0)
Hemoglobin: 13 g/dL (ref 12.0–15.0)
Immature Granulocytes: 1 %
Lymphocytes Relative: 40 %
Lymphs Abs: 3.8 10*3/uL (ref 0.7–4.0)
MCH: 23.9 pg — ABNORMAL LOW (ref 26.0–34.0)
MCHC: 31.6 g/dL (ref 30.0–36.0)
MCV: 75.7 fL — ABNORMAL LOW (ref 80.0–100.0)
Monocytes Absolute: 0.9 10*3/uL (ref 0.1–1.0)
Monocytes Relative: 9 %
Neutro Abs: 4.6 10*3/uL (ref 1.7–7.7)
Neutrophils Relative %: 49 %
Platelets: 184 10*3/uL (ref 150–400)
RBC: 5.43 MIL/uL — ABNORMAL HIGH (ref 3.87–5.11)
RDW: 16.5 % — ABNORMAL HIGH (ref 11.5–15.5)
WBC: 9.5 10*3/uL (ref 4.0–10.5)
nRBC: 0 % (ref 0.0–0.2)

## 2019-04-24 LAB — COMPREHENSIVE METABOLIC PANEL
ALT: 26 U/L (ref 0–44)
AST: 28 U/L (ref 15–41)
Albumin: 3.8 g/dL (ref 3.5–5.0)
Alkaline Phosphatase: 64 U/L (ref 38–126)
Anion gap: 13 (ref 5–15)
BUN: 21 mg/dL (ref 8–23)
CO2: 22 mmol/L (ref 22–32)
Calcium: 9.5 mg/dL (ref 8.9–10.3)
Chloride: 104 mmol/L (ref 98–111)
Creatinine, Ser: 1.11 mg/dL — ABNORMAL HIGH (ref 0.44–1.00)
GFR calc Af Amer: >60 mL/min (ref >60)
GFR calc non Af Amer: 52 mL/min — ABNORMAL LOW (ref >60)
Glucose, Bld: 102 mg/dL — ABNORMAL HIGH (ref 70–99)
Potassium: 3.1 mmol/L — ABNORMAL LOW (ref 3.5–5.1)
Sodium: 139 mmol/L (ref 135–145)
Total Bilirubin: 0.7 mg/dL (ref 0.3–1.2)
Total Protein: 6.6 g/dL (ref 6.5–8.1)

## 2019-04-24 LAB — GLUCOSE, CAPILLARY: Glucose-Capillary: 99 mg/dL (ref 70–99)

## 2019-04-24 LAB — SARS CORONAVIRUS 2 BY RT PCR (HOSPITAL ORDER, PERFORMED IN ~~LOC~~ HOSPITAL LAB): SARS Coronavirus 2: NEGATIVE

## 2019-04-24 LAB — TROPONIN I (HIGH SENSITIVITY)
Troponin I (High Sensitivity): 6 ng/L (ref <18)
Troponin I (High Sensitivity): 9 ng/L (ref <18)

## 2019-04-24 LAB — BRAIN NATRIURETIC PEPTIDE: B Natriuretic Peptide: 15 pg/mL (ref 0.0–100.0)

## 2019-04-24 LAB — TSH: TSH: 5.149 u[IU]/mL — ABNORMAL HIGH (ref 0.350–4.500)

## 2019-04-24 MED ORDER — METHYLPREDNISOLONE SODIUM SUCC 125 MG IJ SOLR
125.0000 mg | Freq: Once | INTRAMUSCULAR | Status: AC
  Administered 2019-04-24: 125 mg via INTRAVENOUS
  Filled 2019-04-24: qty 2

## 2019-04-24 MED ORDER — SODIUM CHLORIDE 0.9 % IV BOLUS
1000.0000 mL | Freq: Once | INTRAVENOUS | Status: AC
  Administered 2019-04-24: 1000 mL via INTRAVENOUS

## 2019-04-24 MED ORDER — IOHEXOL 350 MG/ML SOLN
75.0000 mL | Freq: Once | INTRAVENOUS | Status: AC | PRN
  Administered 2019-04-24: 75 mL via INTRAVENOUS

## 2019-04-24 MED ORDER — POTASSIUM CHLORIDE CRYS ER 20 MEQ PO TBCR
40.0000 meq | EXTENDED_RELEASE_TABLET | Freq: Once | ORAL | Status: AC
  Administered 2019-04-24: 40 meq via ORAL
  Filled 2019-04-24: qty 2

## 2019-04-24 MED ORDER — GUAIFENESIN 200 MG PO TABS: 400.0000 mg | ORAL_TABLET | Freq: Four times a day (QID) | ORAL | 0 refills | Status: AC | PRN

## 2019-04-24 NOTE — ED Triage Notes (Signed)
PT assisted to wheelchair upon arrival to ED due to unsteadiness. Pt presents pale and diaphoretic. Pt reports increased weakness, shortness of breath, and inability to taste/smell for the last week.

## 2019-04-24 NOTE — ED Provider Notes (Addendum)
[facility] Emergency Department Provider Note  ____________________________________________   First MD Initiated Contact with Patient 04/24/19 1918     (approximate)  I have reviewed the triage vital signs and the nursing notes.   HISTORY  Chief Complaint Weakness    HPI Andrea Glass is a 68 y.o. female with past medical history as below here with generalized weakness.  The patient states that for the last month, she has had recurrent illnesses.  She was reportedly diagnosed with flu at the beginning of the month.  She was then diagnosed with sinus infection 2 weeks ago and has been on antibiotics for this.  She reports that over the last week, she is lost her sense of smell.  She has had progressively worsening generalized weakness and fatigue.  She has had associated difficulty getting around her house and actually fell forward while trying to get her grandson out of the bath today due to this weakness.  She states she had her family subsequent drive her here for further evaluation.  She denies any increase in her cough, though she has a persistent dry cough which she had attributed to asthma.  She reports poor appetite and has been trying to drink enough water, though she admits she feels somewhat dehydrated with dry mouth.  No recent travel.  No known coronavirus contacts.      Past Medical History:  Diagnosis Date  . Arthritis   . Asthma   . Depression    when mother passed away  . Dysrhythmia   . GERD (gastroesophageal reflux disease)   . History of kidney stones   . Hypertension   . Pneumonia   . Pre-diabetes   . Sleep apnea    does not use CPAP    Patient Active Problem List   Diagnosis Date Noted  . Osteoarthritis of right knee 07/30/2016  . 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 ABDOMINAL HYSTERECTOMY    . BACK SURGERY     cervical and lumbar  . ROTATOR CUFF  REPAIR     right  . TOTAL KNEE ARTHROPLASTY Right 07/30/2016   Procedure: RIGHT TOTAL KNEE ARTHROPLASTY;  Surgeon: Kathryne Hitchhristopher Y Blackman, MD;  Location: [facility];  Service: Orthopedics;  Laterality: Right;    Prior to Admission medications   Medication Sig Start Date End Date Taking? Authorizing Provider  acetaminophen (TYLENOL) 500 MG tablet Take 500-1,000 mg by mouth every 6 (six) hours as needed (for pain.).    [provider]  fluticasone (FLONASE) 50 MCG/ACT nasal spray Place 1-2 sprays into both nostrils daily as needed for allergies.    [provider]  hydrochlorothiazide (HYDRODIURIL) 25 MG tablet Take 25 mg by mouth daily. 07/02/16   [provider]  oxymetazoline (AFRIN) 0.05 % nasal spray Place 1 spray into both nostrils 2 (two) times daily as needed for congestion.    [provider]  ranitidine (ZANTAC) 150 MG tablet Take 150 mg by mouth 2 (two) times daily. 07/02/16   [provider]  rivaroxaban (XARELTO) 10 MG TABS tablet Take 1 tablet (10 mg total) by mouth daily with breakfast. 08/02/16   Kathryne HitchBlackman, Christopher Y, MD  sertraline (ZOLOFT) 50 MG tablet Take 50 mg by mouth daily as needed. For mood enhancer. 07/18/16   [provider]  VENTOLIN HFA 108 (90 Base) MCG/ACT inhaler Inhale 1-2 puffs into the lungs every 6 (six) hours as needed  for shortness of breath or wheezing. 07/18/16   [provider]    Allergies Latex  No family history on file.  Social History Social History   Tobacco Use  . Smoking status: Never Smoker  . Smokeless tobacco: Never Used  Substance Use Topics  . Alcohol use: No  . Drug use: No    Review of Systems  Review of Systems  Constitutional: Positive for fatigue.     ____________________________________________  PHYSICAL EXAM:      VITAL SIGNS: ED Triage Vitals  Enc Vitals Group     BP 04/24/19 1850 (!) 158/85     Pulse Rate 04/24/19 1850 78     Resp 04/24/19 1850 (!) 28      Temp 04/24/19 1850 98.8 F (37.1 C)     Temp Source 04/24/19 1850 Oral     SpO2 04/24/19 1850 96 %     Weight 04/24/19 1851 293 lb (132.9 kg)     Height 04/24/19 1851 5\' 5"  (1.651 m)     Head Circumference --      Peak Flow --      Pain Score 04/24/19 1851 0     Pain Loc --      Pain Edu? --      Excl. in GC? --      Physical Exam Vitals signs and nursing note reviewed.  Constitutional:      General: She is not in acute distress.    Appearance: She is well-developed.  HENT:     Head: Normocephalic and atraumatic.  Eyes:     Conjunctiva/sclera: Conjunctivae normal.  Neck:     Musculoskeletal: Neck supple.  Cardiovascular:     Rate and Rhythm: Normal rate and regular rhythm.     Heart sounds: Normal heart sounds. No murmur. No friction rub.  Pulmonary:     Effort: Pulmonary effort is normal. No respiratory distress.     Breath sounds: Rhonchi (scattered, occasional) present. No wheezing or rales.  Abdominal:     General: There is no distension.     Palpations: Abdomen is soft.     Tenderness: There is no abdominal tenderness.  Skin:    General: Skin is warm.     Capillary Refill: Capillary refill takes less than 2 seconds.  Neurological:     Mental Status: She is alert and oriented to person, place, and time.     Motor: No abnormal muscle tone.       ____________________________________________   LABS (all labs ordered are listed, but only abnormal results are displayed)  Labs Reviewed  CBC WITH DIFFERENTIAL/PLATELET - Abnormal; Notable for the following components:      Result Value   RBC 5.43 (*)    MCV 75.7 (*)    MCH 23.9 (*)    RDW 16.5 (*)    All other components within normal limits  COMPREHENSIVE METABOLIC PANEL - Abnormal; Notable for the following components:   Potassium 3.1 (*)    Glucose, Bld 102 (*)    Creatinine, Ser 1.11 (*)    GFR calc non Af Amer 52 (*)    All other components within normal limits  SARS CORONAVIRUS 2 (HOSPITAL ORDER,  PERFORMED IN [facility])  TROPONIN I (HIGH SENSITIVITY)  GLUCOSE, CAPILLARY  BRAIN NATRIURETIC PEPTIDE  TROPONIN I (HIGH SENSITIVITY)  CORTISOL  TSH  URINALYSIS, COMPLETE (UACMP) WITH MICROSCOPIC    ____________________________________________  EKG: Normal sinus rhythm, ventricular rate 73.  PR 174, QTc 420.  No acute ischemic changes ________________________________________  RADIOLOGY All imaging, including plain films, CT scans, and ultrasounds, independently reviewed by me, and interpretations confirmed via formal radiology reads.  ED MD interpretation:   CXR: Bibasilar atelectasis vs. PNA  Official radiology report(s): Dg Chest Portable 1 View  Result Date: 04/24/2019 CLINICAL DATA:  Weakness. EXAM: PORTABLE CHEST 1 VIEW COMPARISON:  None. FINDINGS: Findings are suspicious for subtle bibasilar airspace opacities, especially at the right lung base. There is no pneumothorax. No large pleural effusion. The heart size is borderline enlarged. There is no acute osseous abnormality. IMPRESSION: 1. Findings suspicious for bibasilar airspace opacities, greatest at the right lung base. These could represent atelectasis or a developing infiltrate. 2. Borderline enlarged heart. Electronically Signed   By: Katherine Mantlehristopher  Green M.D.   On: 04/24/2019 20:09    ____________________________________________  PROCEDURES   Procedure(s) performed (including Critical Care):  Procedures  ____________________________________________  INITIAL IMPRESSION / MDM / ASSESSMENT AND PLAN / ED COURSE  As part of my medical decision making, I reviewed the following data within the electronic MEDICAL RECORD NUMBER Notes from prior ED visits and [place] Controlled Substance Database      *Andrea Mealingrene S Hambly was evaluated in Emergency Department on 04/24/2019 for the symptoms described in the history of present illness. She was evaluated in the context of the global COVID-19 pandemic, which necessitated  consideration that the patient might be at risk for infection with the SARS-CoV-2 virus that causes COVID-19. Institutional protocols and algorithms that pertain to the evaluation of patients at risk for COVID-19 are in a state of rapid change based on information released by regulatory bodies including the CDC and federal and state organizations. These policies and algorithms were followed during the patient's care in the ED.  Some ED evaluations and interventions may be delayed as a result of limited staffing during the pandemic.*      Medical Decision Making: 68 year old female with history of pulmonary sarcoidosis here with generalized weakness and cough.  Patient has had cough and shortness of breath for the last month, but it is worsened over the last several days in addition to generalized weakness.  Regarding her weakness, differential is broad and includes ongoing infection, dehydration, superimposed pneumonia.  Of note, she also did not take her prednisone today, so will give a dose here though she does not appear hypotensive or adrenally insufficient clinically.  Will be very cautious with her and plan to reassess after fluids and steroids.  COVID pending.  Chest x-ray shows possible bibasilar atelectasis versus pneumonia.  But she does not appear to have an acute leukocytosis, low threshold for admission for infection with chronic prednisone use, already on Augmentin.  Given her chronic cough for the last month with persistent shortness of breath and now generalized weakness, will obtain a CT angios to further assess for occult PE, pulmonary sarcoidosis involvement, or pneumonia.  If no pneumonia is noted, she is COVID negative, and feels better after fluids, would be reasonable to attempt outpatient management. However, if CT is concerning for significant pneumonia, pulmonary sarcoidosis exacerbation, PE, would likely need admission.  ____________________________________________  FINAL  CLINICAL IMPRESSION(S) / ED DIAGNOSES  Final diagnoses:  Generalized weakness  Dehydration  Hypokalemia     MEDICATIONS GIVEN DURING THIS VISIT:  Medications  potassium chloride SA (K-DUR) CR tablet 40 mEq (has no administration in time range)  sodium chloride 0.9 % bolus 1,000 mL (1,000 mLs Intravenous New Bag/Given 04/24/19 2024)  methylPREDNISolone sodium succinate (SOLU-MEDROL) 125 mg/2  mL injection 125 mg (125 mg Intravenous Given 04/24/19 2024)     ED Discharge Orders    None       Note:  This document was prepared using Dragon voice recognition software and may include unintentional dictation errors.   Duffy Bruce, MD 04/24/19 2121    Duffy Bruce, MD 05/11/19 [phone]

## 2019-04-24 NOTE — Discharge Instructions (Addendum)
Continue to take your regular medications as prescribed.  You can take the guaifenesin prescribed today to help loosen the phlegm in your chest.  Follow-up with your regular doctor in the next week.  Return to the ER for new, worsening, or persistent generalized weakness, lightheadedness or feeling like you are going to pass out, shortness of breath, fever, or any other new or worsening symptoms that concern you.

## 2019-04-24 NOTE — ED Provider Notes (Signed)
-----------------------------------------   11:36 PM on 04/24/2019 -----------------------------------------  I took over care of this patient from Dr. Ellender Hose.  The patient presented with generalized weakness and some shortness of breath.  She has a history of sarcoidosis.  The plan was that if the patient was negative for COVID, had no evidence of pneumonia or PE and was stable after fluids, she could possibly go home.  CT shows no evidence of PE or pneumonia.  COVID is negative.  The patient's vital signs remain stable.  On reassessment, she appears well and has no increased work of breathing.  She is speaking in full sentences.  She feels comfortable going home but just wants something to help break up the phlegm.  Return precautions given, and she expresses understanding.   Arta Silence, MD 04/24/19 2337

## 2019-10-17 ENCOUNTER — Emergency Department
Admission: EM | Admit: 2019-10-17 | Discharge: 2019-10-17 | Disposition: A | Discharge status: Home / Self Care | Payer: Medicare HMO | Attending: Emergency Medicine | Admitting: Emergency Medicine

## 2019-10-17 ENCOUNTER — Other Ambulatory Visit: Payer: Self-pay

## 2019-10-17 ENCOUNTER — Encounter: Payer: Self-pay | Admitting: *Deleted

## 2019-10-17 ENCOUNTER — Emergency Department: Payer: Medicare HMO

## 2019-10-17 DIAGNOSIS — Z7901 Long term (current) use of anticoagulants: Secondary | ICD-10-CM | POA: Diagnosis not present

## 2019-10-17 DIAGNOSIS — Z96651 Presence of right artificial knee joint: Secondary | ICD-10-CM | POA: Insufficient documentation

## 2019-10-17 DIAGNOSIS — J45909 Unspecified asthma, uncomplicated: Secondary | ICD-10-CM | POA: Insufficient documentation

## 2019-10-17 DIAGNOSIS — Z79899 Other long term (current) drug therapy: Secondary | ICD-10-CM | POA: Diagnosis not present

## 2019-10-17 DIAGNOSIS — J111 Influenza due to unidentified influenza virus with other respiratory manifestations: Secondary | ICD-10-CM | POA: Diagnosis not present

## 2019-10-17 DIAGNOSIS — I10 Essential (primary) hypertension: Secondary | ICD-10-CM | POA: Diagnosis not present

## 2019-10-17 DIAGNOSIS — U071 COVID-19: Secondary | ICD-10-CM

## 2019-10-17 DIAGNOSIS — Z91040 Latex allergy status: Secondary | ICD-10-CM | POA: Diagnosis not present

## 2019-10-17 DIAGNOSIS — R519 Headache, unspecified: Secondary | ICD-10-CM | POA: Diagnosis present

## 2019-10-17 DIAGNOSIS — Z20828 Contact with and (suspected) exposure to other viral communicable diseases: Secondary | ICD-10-CM | POA: Insufficient documentation

## 2019-10-17 LAB — CBC WITH DIFFERENTIAL/PLATELET
Abs Immature Granulocytes: 0.05 10*3/uL (ref 0.00–0.07)
Basophils Absolute: 0 10*3/uL (ref 0.0–0.1)
Basophils Relative: 0 %
Eosinophils Absolute: 0 10*3/uL (ref 0.0–0.5)
Eosinophils Relative: 0 %
HCT: 38 % (ref 36.0–46.0)
Hemoglobin: 12.2 g/dL (ref 12.0–15.0)
Immature Granulocytes: 0 %
Lymphocytes Relative: 9 %
Lymphs Abs: 1.3 10*3/uL (ref 0.7–4.0)
MCH: 23.7 pg — ABNORMAL LOW (ref 26.0–34.0)
MCHC: 32.1 g/dL (ref 30.0–36.0)
MCV: 73.8 fL — ABNORMAL LOW (ref 80.0–100.0)
Monocytes Absolute: 2.1 10*3/uL — ABNORMAL HIGH (ref 0.1–1.0)
Monocytes Relative: 14 %
Neutro Abs: 11.2 10*3/uL — ABNORMAL HIGH (ref 1.7–7.7)
Neutrophils Relative %: 77 %
Platelets: 187 10*3/uL (ref 150–400)
RBC: 5.15 MIL/uL — ABNORMAL HIGH (ref 3.87–5.11)
RDW: 15.3 % (ref 11.5–15.5)
WBC: 14.6 10*3/uL — ABNORMAL HIGH (ref 4.0–10.5)
nRBC: 0 % (ref 0.0–0.2)

## 2019-10-17 LAB — COMPREHENSIVE METABOLIC PANEL
ALT: 25 U/L (ref 0–44)
AST: 28 U/L (ref 15–41)
Albumin: 3.9 g/dL (ref 3.5–5.0)
Alkaline Phosphatase: 76 U/L (ref 38–126)
Anion gap: 14 (ref 5–15)
BUN: 16 mg/dL (ref 8–23)
CO2: 26 mmol/L (ref 22–32)
Calcium: 9.5 mg/dL (ref 8.9–10.3)
Chloride: 97 mmol/L — ABNORMAL LOW (ref 98–111)
Creatinine, Ser: 1.35 mg/dL — ABNORMAL HIGH (ref 0.44–1.00)
GFR calc Af Amer: 47 mL/min — ABNORMAL LOW (ref >60)
GFR calc non Af Amer: 41 mL/min — ABNORMAL LOW (ref >60)
Glucose, Bld: 120 mg/dL — ABNORMAL HIGH (ref 70–99)
Potassium: 3.3 mmol/L — ABNORMAL LOW (ref 3.5–5.1)
Sodium: 137 mmol/L (ref 135–145)
Total Bilirubin: 1.7 mg/dL — ABNORMAL HIGH (ref 0.3–1.2)
Total Protein: 7.8 g/dL (ref 6.5–8.1)

## 2019-10-17 LAB — POC SARS CORONAVIRUS 2 AG: SARS Coronavirus 2 Ag: NEGATIVE

## 2019-10-17 LAB — LIPASE, BLOOD: Lipase: 22 U/L (ref 11–51)

## 2019-10-17 MED ORDER — METOCLOPRAMIDE HCL 5 MG/ML IJ SOLN
10.0000 mg | Freq: Once | INTRAMUSCULAR | Status: AC
  Administered 2019-10-17: 08:00:00 10 mg via INTRAVENOUS
  Filled 2019-10-17: qty 2

## 2019-10-17 MED ORDER — SODIUM CHLORIDE 0.9 % IV BOLUS
1000.0000 mL | Freq: Once | INTRAVENOUS | Status: AC
  Administered 2019-10-17: 1000 mL via INTRAVENOUS

## 2019-10-17 MED ORDER — ONDANSETRON 4 MG PO TBDP: 4.0000 mg | ORAL_TABLET | Freq: Three times a day (TID) | ORAL | 0 refills | Status: AC | PRN

## 2019-10-17 MED ORDER — KETOROLAC TROMETHAMINE 30 MG/ML IJ SOLN
15.0000 mg | INTRAMUSCULAR | Status: AC
  Administered 2019-10-17: 15 mg via INTRAVENOUS
  Filled 2019-10-17: qty 1

## 2019-10-17 MED ORDER — NAPROXEN 500 MG PO TABS: 500.0000 mg | ORAL_TABLET | Freq: Two times a day (BID) | ORAL | 0 refills | Status: AC

## 2019-10-17 MED ORDER — FAMOTIDINE IN NACL 20-0.9 MG/50ML-% IV SOLN
20.0000 mg | Freq: Once | INTRAVENOUS | Status: AC
  Administered 2019-10-17: 20 mg via INTRAVENOUS
  Filled 2019-10-17: qty 50

## 2019-10-17 MED ORDER — FAMOTIDINE 20 MG PO TABS: 20.0000 mg | ORAL_TABLET | Freq: Two times a day (BID) | ORAL | 0 refills | Status: AC

## 2019-10-17 NOTE — ED Provider Notes (Signed)
[facility] Emergency Department Provider Note  ____________________________________________  Time seen: Approximately 8:13 AM  I have reviewed the triage vital signs and the nursing notes.   HISTORY  Chief Complaint Headache    HPI Andrea Glass is a 68 y.o. female with a history of GERD hypertension sarcoidosis asthma and prediabetes who comes the ED complaining of gradual onset of bilateral frontal headache for the last 3 days, constant, no aggravating or alleviating factors, associated runny nose, congestion, nonproductive cough, body aches.  Also has malaise nausea and decreased oral intake for last 2 days.  No known Covid positive contacts.  Denies trauma.  No focal neurologic symptoms.  Denies fever but has persistent chills.  No chest pain or shortness of breath.  No belly pain.    Past Medical History:  Diagnosis Date  . Arthritis   . Asthma   . Depression    when mother passed away  . Dysrhythmia   . GERD (gastroesophageal reflux disease)   . History of kidney stones   . Hypertension   . Pneumonia   . Pre-diabetes   . Sleep apnea    does not use CPAP     Patient Active Problem List   Diagnosis Date Noted  . Osteoarthritis of right knee 07/30/2016  . Status post total right knee replacement 07/30/2016     Past Surgical History:  Procedure Laterality Date  . ABDOMINAL HYSTERECTOMY    . BACK SURGERY     cervical and lumbar  . ROTATOR CUFF REPAIR     right  . TOTAL KNEE ARTHROPLASTY Right 07/30/2016   Procedure: RIGHT TOTAL KNEE ARTHROPLASTY;  Surgeon: Mcarthur Rossetti, MD;  Location: [facility];  Service: Orthopedics;  Laterality: Right;     Prior to Admission medications   Medication Sig Start Date End Date Taking? Authorizing Provider  acetaminophen (TYLENOL) 500 MG tablet Take 500-1,000 mg by mouth every 6 (six) hours as needed (for pain.).    [provider]  famotidine (PEPCID) 20 MG tablet Take 1 tablet (20  mg total) by mouth 2 (two) times daily. 10/17/19   Carrie Mew, MD  fluticasone [facility]) 50 MCG/ACT nasal spray Place 1-2 sprays into both nostrils daily as needed for allergies.    [provider]  guaiFENesin 200 MG tablet Take 2 tablets (400 mg total) by mouth every 6 (six) hours as needed for cough or to loosen phlegm. 04/24/19   Arta Silence, MD  hydrochlorothiazide (HYDRODIURIL) 25 MG tablet Take 25 mg by mouth daily. 07/02/16   [provider]  naproxen (NAPROSYN) 500 MG tablet Take 1 tablet (500 mg total) by mouth 2 (two) times daily with a meal. 10/17/19   Carrie Mew, MD  ondansetron (ZOFRAN ODT) 4 MG disintegrating tablet Take 1 tablet (4 mg total) by mouth every 8 (eight) hours as needed for nausea or vomiting. 10/17/19   Carrie Mew, MD  oxymetazoline (AFRIN) 0.05 % nasal spray Place 1 spray into both nostrils 2 (two) times daily as needed for congestion.    [provider]  ranitidine (ZANTAC) 150 MG tablet Take 150 mg by mouth 2 (two) times daily. 07/02/16   [provider]  rivaroxaban (XARELTO) 10 MG TABS tablet Take 1 tablet (10 mg total) by mouth daily with breakfast. 08/02/16   Mcarthur Rossetti, MD  sertraline (ZOLOFT) 50 MG tablet Take 50 mg by mouth daily as needed. For mood enhancer. 07/18/16   [provider]  VENTOLIN HFA 108 (  90 Base) MCG/ACT inhaler Inhale 1-2 puffs into the lungs every 6 (six) hours as needed for shortness of breath or wheezing. 07/18/16   [provider]     Allergies Latex   No family history on file.  Social History Social History   Tobacco Use  . Smoking status: Never Smoker  . Smokeless tobacco: Never Used  Substance Use Topics  . Alcohol use: No  . Drug use: No    Review of Systems  Constitutional:   No fever positive chills.  ENT:   Positive sore throat.  Positive rhinorrhea. Cardiovascular:   No chest pain or syncope. Respiratory:   No dyspnea,  positive nonproductive cough. Gastrointestinal:   Negative for abdominal pain, positive for occasional vomiting and diarrhea.  Musculoskeletal: Positive generalized body aches All other systems reviewed and are negative except as documented above in ROS and HPI.  ____________________________________________   PHYSICAL EXAM:  VITAL SIGNS: ED Triage Vitals  Enc Vitals Group     BP 10/17/19 0032 (!) 126/51     Pulse Rate 10/17/19 0032 86     Resp 10/17/19 0032 20     Temp 10/17/19 0032 99.1 F (37.3 C)     Temp Source 10/17/19 0032 Oral     SpO2 10/17/19 0032 96 %     Weight --      Height --      Head Circumference --      Peak Flow --      Pain Score 10/17/19 0030 8     Pain Loc --      Pain Edu? --      Excl. in GC? --     Vital signs reviewed, nursing assessments reviewed. Oxygen saturation 100% on room air on my exam  Constitutional:   Alert and oriented. Non-toxic appearance.  Morbidly obese Eyes:   Conjunctivae are normal. EOMI. PERRL. ENT      Head:   Normocephalic and atraumatic.      Nose:   Wearing a mask.      Mouth/Throat:   Wearing a mask.      Neck:   No meningismus. Full ROM. Hematological/Lymphatic/Immunilogical:   No cervical lymphadenopathy. Cardiovascular:   RRR. Symmetric bilateral radial and DP pulses.  No murmurs. Cap refill less than 2 seconds. Respiratory:   Normal respiratory effort without tachypnea/retractions. Breath sounds are clear and equal bilaterally. No wheezes/rales/rhonchi. Gastrointestinal:   Soft and nontender. Non distended. There is no CVA tenderness.  No rebound, rigidity, or guarding.  Musculoskeletal:   Normal range of motion in all extremities. No joint effusions.  No lower extremity tenderness.  No edema. Neurologic:   Normal speech and language.  Motor grossly intact. No acute focal neurologic deficits are appreciated.  Skin:    Skin is warm, dry and intact. No rash noted.  No petechiae, purpura, or  bullae.  ____________________________________________    LABS (pertinent positives/negatives) (all labs ordered are listed, but only abnormal results are displayed) Labs Reviewed  CBC WITH DIFFERENTIAL/PLATELET - Abnormal; Notable for the following components:      Result Value   WBC 14.6 (*)    RBC 5.15 (*)    MCV 73.8 (*)    MCH 23.7 (*)    Neutro Abs 11.2 (*)    Monocytes Absolute 2.1 (*)    All other components within normal limits  COMPREHENSIVE METABOLIC PANEL - Abnormal; Notable for the following components:   Potassium 3.3 (*)    Chloride 97 (*)  Glucose, Bld 120 (*)    Creatinine, Ser 1.35 (*)    Total Bilirubin 1.7 (*)    GFR calc non Af Amer 41 (*)    GFR calc Af Amer 47 (*)    All other components within normal limits  NOVEL CORONAVIRUS, NAA (HOSP ORDER, SEND-OUT TO REF LAB; TAT 18-24 HRS)  LIPASE, BLOOD  POC SARS CORONAVIRUS 2 AG -  ED  POC SARS CORONAVIRUS 2 AG   ____________________________________________   EKG    ____________________________________________    RADIOLOGY  DG Chest Portable 1 View  Result Date: 10/17/2019 CLINICAL DATA:  Chills and body aches. Cough. History of sarcoid and asthma. Rule out COVID-19. EXAM: PORTABLE CHEST 1 VIEW COMPARISON:  04/24/2019 FINDINGS: The heart size and mediastinal contours are within normal limits. Both lungs are clear. The visualized skeletal structures are unremarkable. IMPRESSION: No active disease. Electronically Signed   By: Marlan Palau M.D.   On: 10/17/2019 08:14    ____________________________________________   PROCEDURES Procedures  ____________________________________________  DIFFERENTIAL DIAGNOSIS   COVID-19, pneumonia, dehydration, electrolyte abnormality  CLINICAL IMPRESSION / ASSESSMENT AND PLAN / ED COURSE  Medications ordered in the ED: Medications  sodium chloride 0.9 % bolus 1,000 mL (0 mLs Intravenous Stopped 10/17/19 0944)  ketorolac (TORADOL) 30 MG/ML injection 15 mg  (15 mg Intravenous Given 10/17/19 0758)  metoCLOPramide (REGLAN) injection 10 mg (10 mg Intravenous Given 10/17/19 0757)  famotidine (PEPCID) IVPB 20 mg premix (20 mg Intravenous New Bag/Given 10/17/19 0912)    Pertinent labs & imaging results that were available during my care of the patient were reviewed by me and considered in my medical decision making (see chart for details).  Andrea Glass was evaluated in Emergency Department on 10/17/2019 for the symptoms described in the history of present illness. She was evaluated in the context of the global COVID-19 pandemic, which necessitated consideration that the patient might be at risk for infection with the SARS-CoV-2 virus that causes COVID-19. Institutional protocols and algorithms that pertain to the evaluation of patients at risk for COVID-19 are in a state of rapid change based on information released by regulatory bodies including the CDC and federal and state organizations. These policies and algorithms were followed during the patient's care in the ED.   Patient presents with body aches chills malaise.  Clinically consistent with COVID-19/influenza-like illness.Considering the patient's symptoms, medical history, and physical examination today, I have low suspicion for cholecystitis or biliary pathology, pancreatitis, perforation or bowel obstruction, hernia, intra-abdominal abscess, AAA or dissection, volvulus or intussusception, mesenteric ischemia, or appendicitis.  No evidence of any focal bacterial infection, not septic.  Vital signs are reassuring.  No acute respiratory symptoms or compromise.  Will check labs, Covid test, chest x-ray, give IV Toradol, Reglan, Pepcid for symptom relief with headache and malaise/nausea.  She is tolerating oral intake in the ED.  Clinical Course as of Oct 16 999  Wed Oct 17, 2019  7124 Chest x-ray unremarkable without evidence of pneumonia.   [PS]  [id number] Labs reveal a leukocytosis of 14,000 but  otherwise reassuring lab panel.  Chest x-ray is unremarkable.  No urinary symptoms, no meningismus, no skin or soft tissue infection.  Symptoms all appear to be due to a viral syndrome and patient is otherwise stable for discharge home.  Clinically I suspect Covid and will order a [facility] send out PCR.   [PS]    Clinical Course User Index [PS] Sharman Cheek, MD     ____________________________________________  FINAL CLINICAL IMPRESSION(S) / ED DIAGNOSES    Final diagnoses:  Clinical diagnosis of COVID-19  Influenza-like illness     ED Discharge Orders         Ordered    naproxen (NAPROSYN) 500 MG tablet  2 times daily with meals     10/17/19 0959    famotidine (PEPCID) 20 MG tablet  2 times daily     10/17/19 0959    ondansetron (ZOFRAN ODT) 4 MG disintegrating tablet  Every 8 hours PRN     10/17/19 0959          Portions of this note were generated with dragon dictation software. Dictation errors may occur despite best attempts at proofreading.   Sharman CheekStafford, Kemi Gell, MD 10/17/19 1000

## 2019-10-17 NOTE — ED Notes (Signed)
ED Provider at bedside. 

## 2019-10-17 NOTE — ED Triage Notes (Signed)
3 days of headaches "feels like a sinus infection", runny nose and body aches "since it started getting cold". Denies fevers, has had chills.

## 2019-10-18 LAB — NOVEL CORONAVIRUS, NAA (HOSP ORDER, SEND-OUT TO REF LAB; TAT 18-24 HRS): SARS-CoV-2, NAA: NOT DETECTED

## 2020-05-09 IMAGING — DX PORTABLE CHEST - 1 VIEW
1 series · 1 of 1 positions shown · non-contrast
Comparison: None.

CLINICAL DATA: Weakness.

EXAM:
PORTABLE CHEST 1 VIEW

[chest ap]
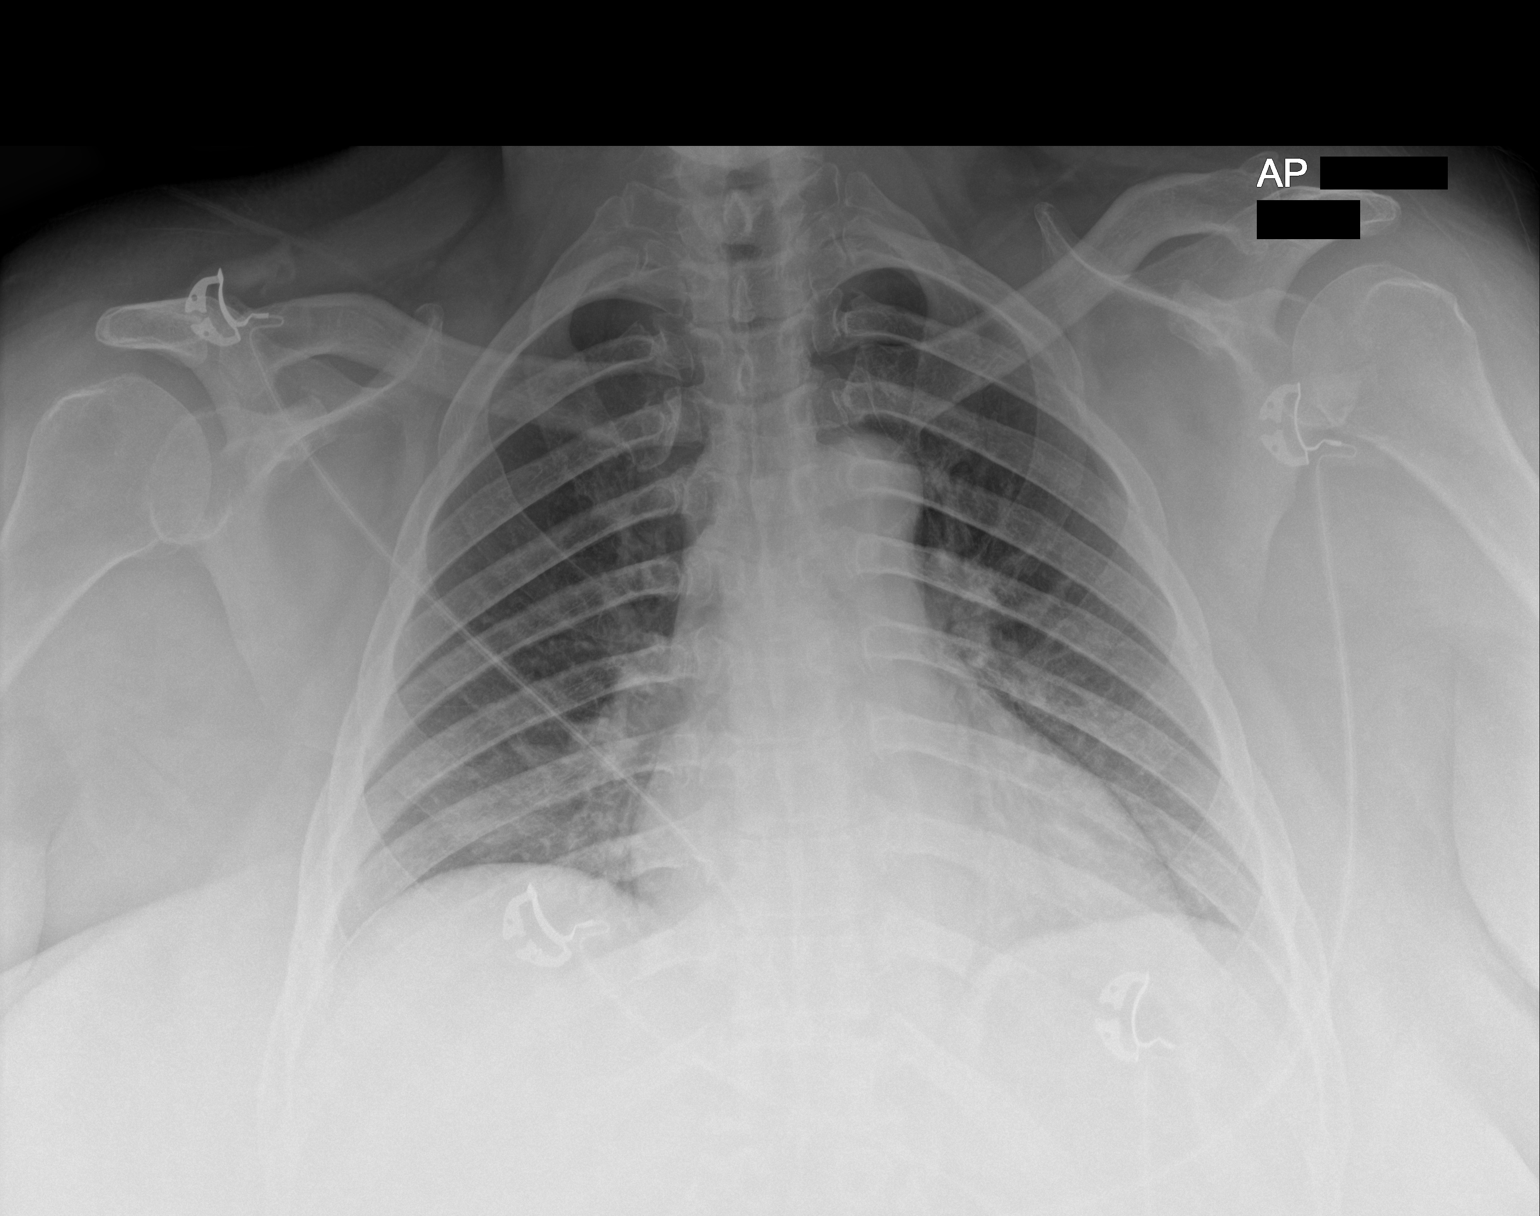

[1 of 1 positions shown; findings below may reference images not displayed]

FINDINGS: Findings are suspicious for subtle bibasilar airspace opacities,
especially at the right lung base. There is no pneumothorax. No
large pleural effusion. The heart size is borderline enlarged. There
is no acute osseous abnormality.
IMPRESSION: 1. Findings suspicious for bibasilar airspace opacities, greatest at
the right lung base. These could represent atelectasis or a
developing infiltrate.
2. Borderline enlarged heart.

## 2020-05-09 IMAGING — CT CT ANGIOGRAPHY CHEST
2 of 6 series · 18 of 46 positions shown · IV contrast (APPLIED)
Comparison: None.

CLINICAL DATA: Weakness.  Shortness of breath.

EXAM:
CT ANGIOGRAPHY CHEST WITH CONTRAST
TECHNIQUE: Multidetector CT imaging of the chest was performed using the
standard protocol during bolus administration of intravenous
contrast. Multiplanar CT image reconstructions and MIPs were
obtained to evaluate the vascular anatomy.
CONTRAST:  75mL OMNIPAQUE IOHEXOL 350 MG/ML SOLN

[Series 5: thins · axial · 0.58mm/px · z∈[+25,+235]mm · 15 of 232 slices shown]
[im 11/232  lung]
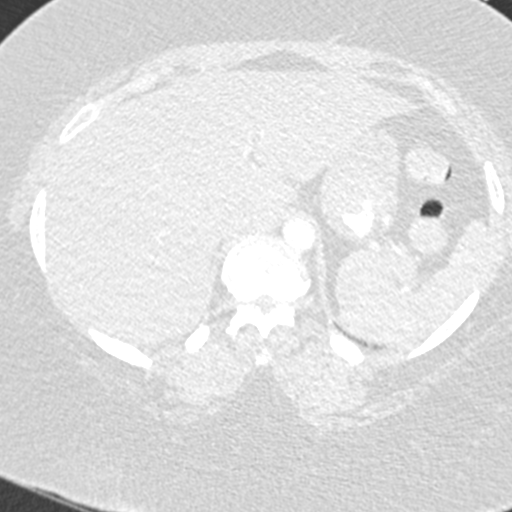
[im 31/232  soft-tissue]
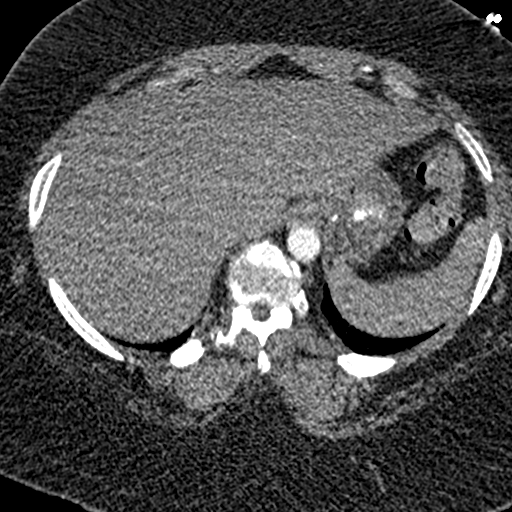
[im 41/232  lung]
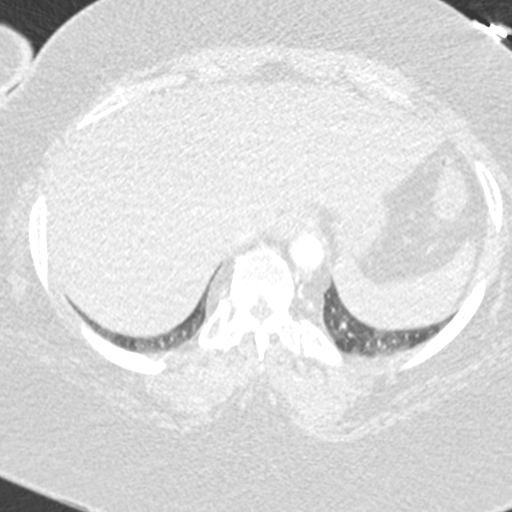
[im 61/232  soft-tissue]
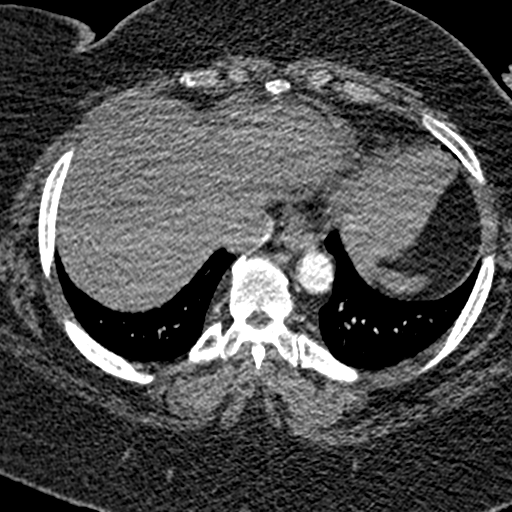
[im 71/232  lung]
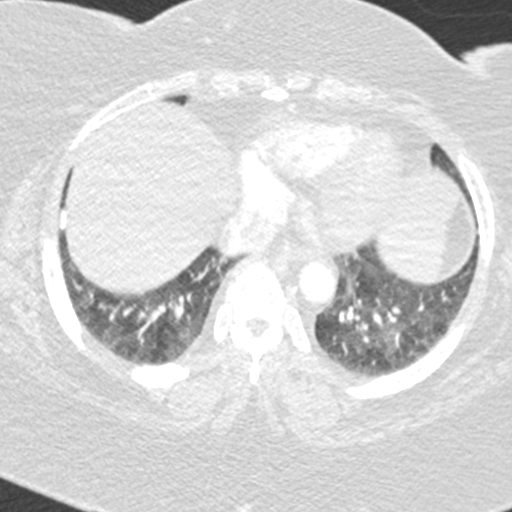
[im 91/232  soft-tissue]
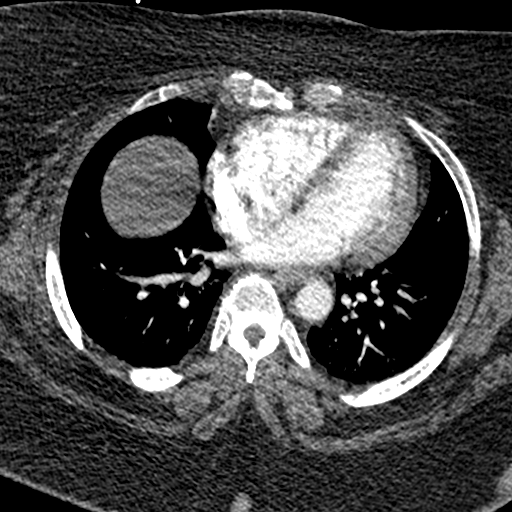
[im 101/232  lung]
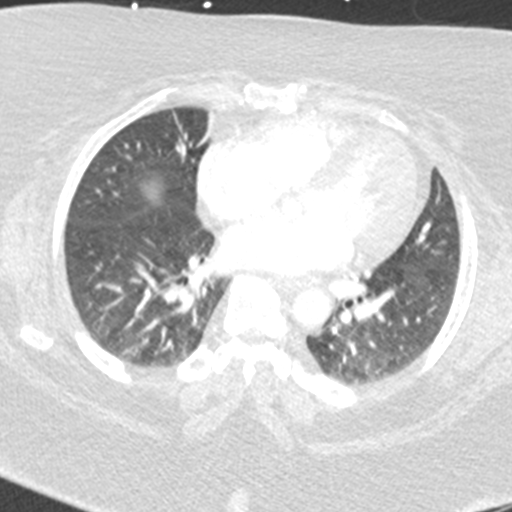
[im 121/232  soft-tissue]
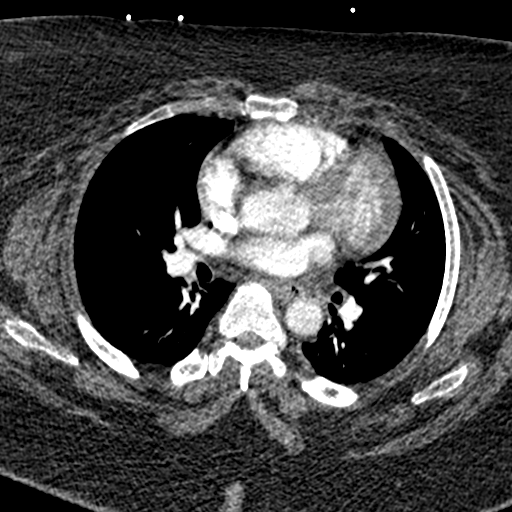
[im 131/232  lung]
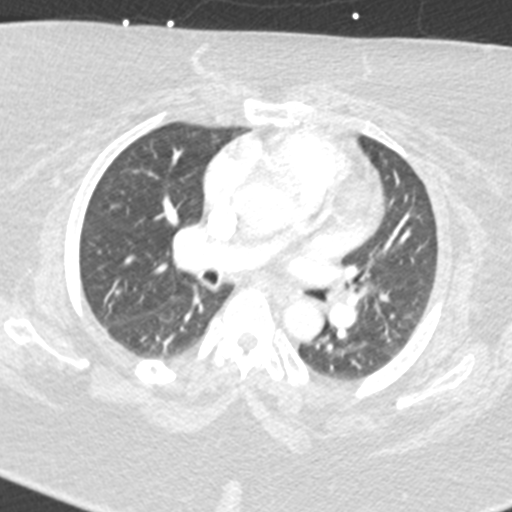
[im 141/232  soft-tissue]
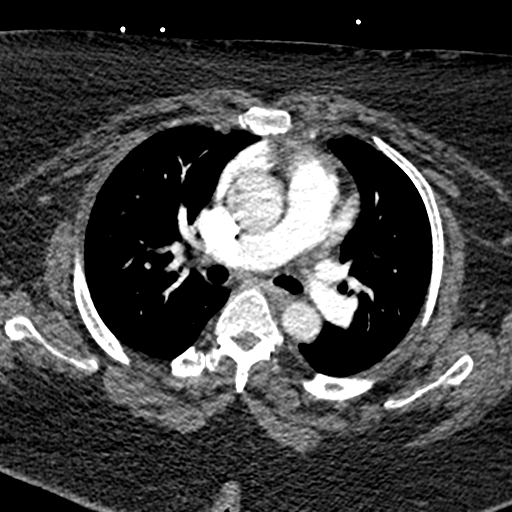
[im 161/232  lung]
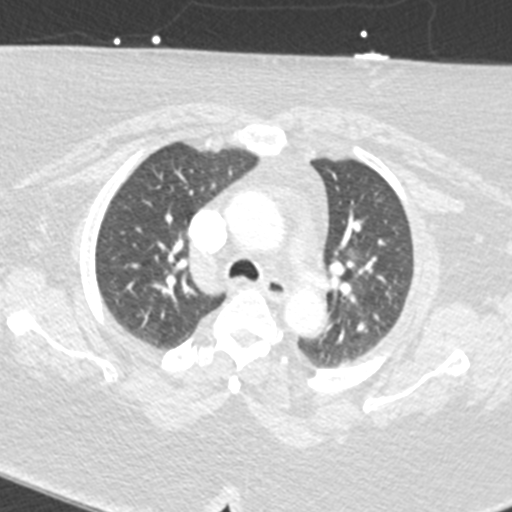
[im 171/232  soft-tissue]
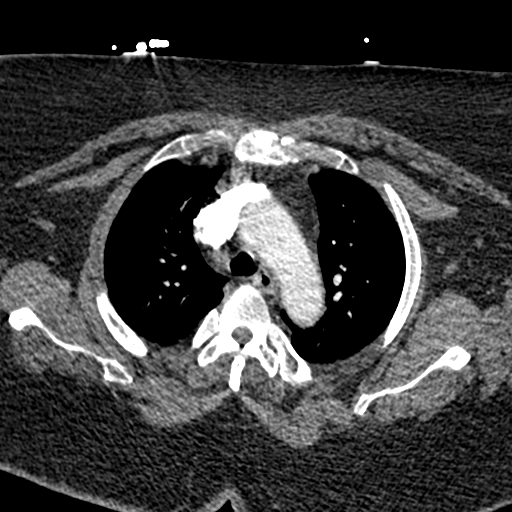
[im 191/232  lung]
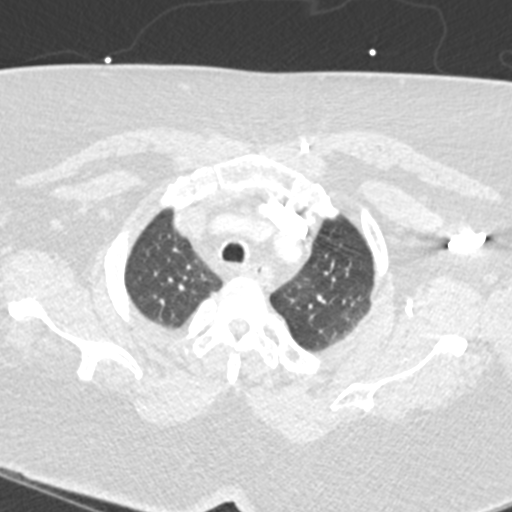
[im 201/232  soft-tissue]
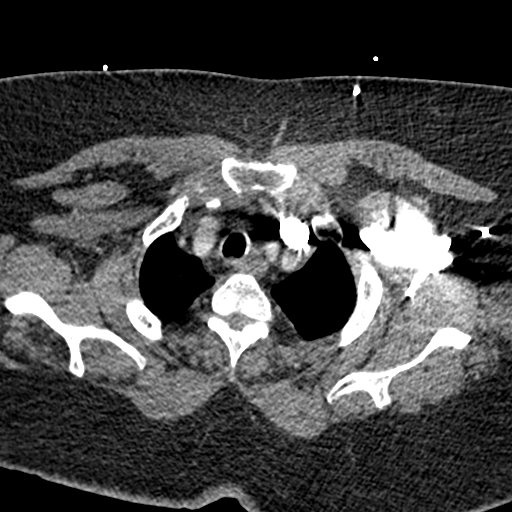
[im 221/232  lung]
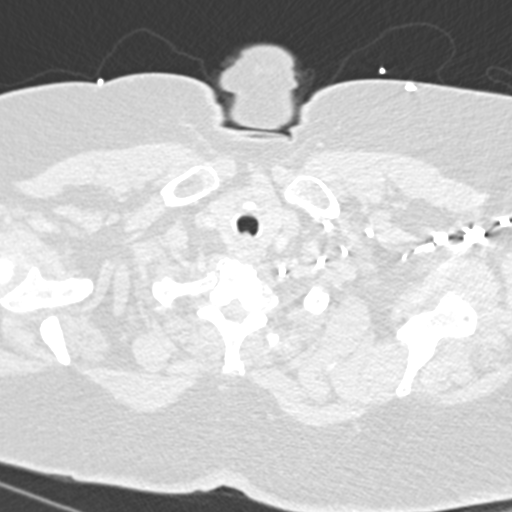

[Series 7: coronal mpr · coronal · 0.49mm/px · 3 of 93 slices shown]
[im 24/93  soft-tissue]
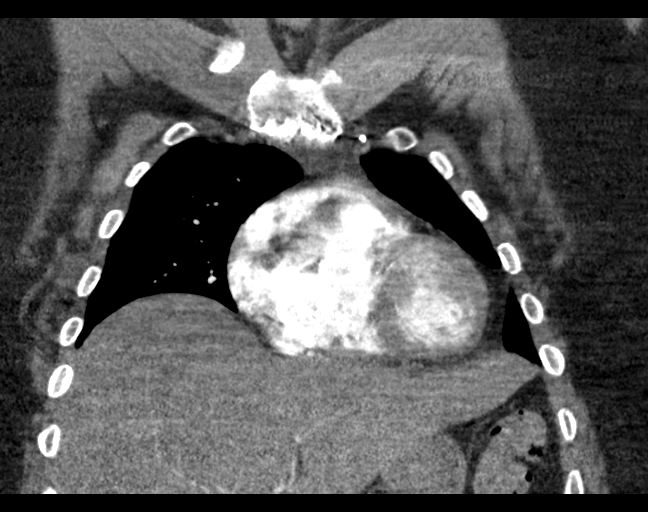
[im 47/93  soft-tissue]
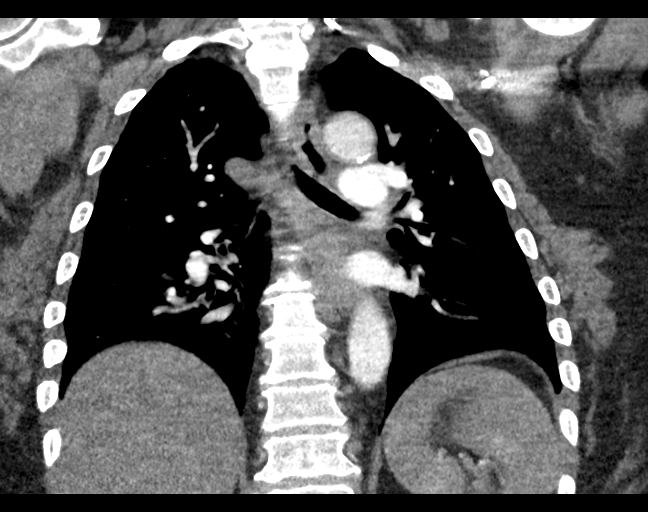
[im 70/93  soft-tissue]
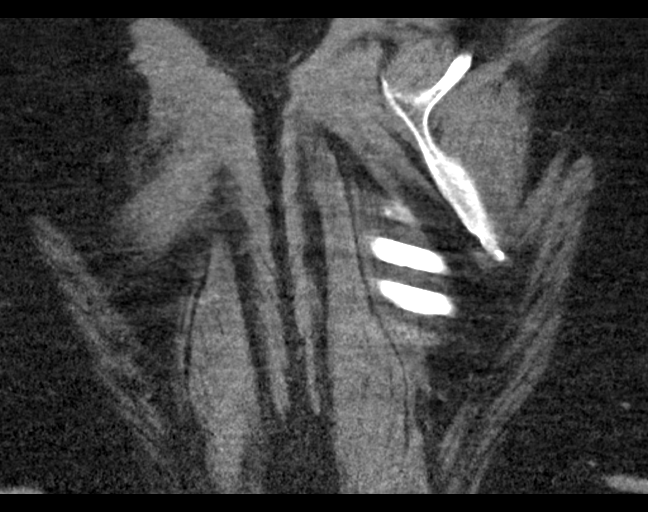

[18 of 46 positions shown; findings below may reference images not displayed]

FINDINGS: Cardiovascular: Contrast injection is sufficient to demonstrate
satisfactory opacification of the pulmonary arteries to the
segmental level. There is no pulmonary embolus. The main pulmonary
artery is within normal limits for size. There is no CT evidence of
acute right heart strain. The visualized aorta is normal. Heart size
is enlarged.

Mediastinum/Nodes:

--No mediastinal or hilar lymphadenopathy.

--No axillary lymphadenopathy.

--No supraclavicular lymphadenopathy.

--Normal thyroid gland.

--The esophagus is unremarkable

Lungs/Pleura: There is no large pleural effusion. There is no
pneumothorax. There is a calcified pleural based plaque along the
peripheral right lower lobe. The trachea is unremarkable.

Upper Abdomen: No acute abnormality.

Musculoskeletal: No chest wall abnormality. No acute or significant
osseous findings.

Review of the MIP images confirms the above findings.
IMPRESSION: 1. No PE.
2. Somewhat low lung volumes with mild bibasilar atelectasis.
3. No pneumothorax or large pleural effusion.
4. Mild cardiac enlargement.

## 2020-09-16 ENCOUNTER — Other Ambulatory Visit: Payer: Self-pay

## 2020-09-16 ENCOUNTER — Encounter: Payer: Self-pay | Admitting: Emergency Medicine

## 2020-09-16 ENCOUNTER — Emergency Department
Admission: EM | Admit: 2020-09-16 | Discharge: 2020-09-16 | Disposition: A | Discharge status: Home / Self Care | Payer: Medicare HMO | Attending: Emergency Medicine | Admitting: Emergency Medicine

## 2020-09-16 DIAGNOSIS — N200 Calculus of kidney: Secondary | ICD-10-CM | POA: Diagnosis not present

## 2020-09-16 DIAGNOSIS — R197 Diarrhea, unspecified: Secondary | ICD-10-CM | POA: Diagnosis not present

## 2020-09-16 DIAGNOSIS — E039 Hypothyroidism, unspecified: Secondary | ICD-10-CM | POA: Insufficient documentation

## 2020-09-16 DIAGNOSIS — H81399 Other peripheral vertigo, unspecified ear: Secondary | ICD-10-CM

## 2020-09-16 DIAGNOSIS — Z91010 Allergy to peanuts: Secondary | ICD-10-CM | POA: Diagnosis not present

## 2020-09-16 DIAGNOSIS — R1031 Right lower quadrant pain: Secondary | ICD-10-CM | POA: Diagnosis present

## 2020-09-16 LAB — BASIC METABOLIC PANEL
Anion gap: 9 (ref 5–15)
BUN: 13 mg/dL (ref 8–23)
CO2: 28 mmol/L (ref 22–32)
Calcium: 9.5 mg/dL (ref 8.9–10.3)
Chloride: 102 mmol/L (ref 98–111)
Creatinine, Ser: 1.02 mg/dL — ABNORMAL HIGH (ref 0.44–1.00)
GFR, Estimated: >60 mL/min (ref >60)
Glucose, Bld: 102 mg/dL — ABNORMAL HIGH (ref 70–99)
Potassium: 3.5 mmol/L (ref 3.5–5.1)
Sodium: 139 mmol/L (ref 135–145)

## 2020-09-16 LAB — CBC
HCT: 40.4 % (ref 36.0–46.0)
Hemoglobin: 12.6 g/dL (ref 12.0–15.0)
MCH: 24 pg — ABNORMAL LOW (ref 26.0–34.0)
MCHC: 31.2 g/dL (ref 30.0–36.0)
MCV: 76.8 fL — ABNORMAL LOW (ref 80.0–100.0)
Platelets: 229 10*3/uL (ref 150–400)
RBC: 5.26 MIL/uL — ABNORMAL HIGH (ref 3.87–5.11)
RDW: 15.9 % — ABNORMAL HIGH (ref 11.5–15.5)
WBC: 4.3 10*3/uL (ref 4.0–10.5)
nRBC: 0 % (ref 0.0–0.2)

## 2020-09-16 MED ORDER — MECLIZINE HCL 25 MG PO TABS: 25.0000 mg | ORAL_TABLET | Freq: Three times a day (TID) | ORAL | 0 refills | Status: AC | PRN

## 2020-09-16 MED ORDER — MECLIZINE HCL 25 MG PO TABS
25.0000 mg | ORAL_TABLET | Freq: Once | ORAL | Status: AC
  Administered 2020-09-16: 25 mg via ORAL
  Filled 2020-09-16: qty 1

## 2020-09-16 NOTE — ED Triage Notes (Signed)
Pt to ED via POV with c/o dizziness, pt states seen at [facility] approx 1 month ago and dx with vertigo, states feels the same. Pt states ran out of medication. Pt also states feels like her mouth is dry, and is worried about possible dehydration. Pt states worse when she closes her eyes and turns her head side to side.

## 2020-09-16 NOTE — ED Provider Notes (Signed)
[facility] Emergency Department Provider Note   ____________________________________________   First MD Initiated Contact with Patient 09/16/20 1544     (approximate)  I have reviewed the triage vital signs and the nursing notes.   HISTORY  Chief Complaint Dizziness    HPI Andrea Glass is a 69 y.o. female with past medical history of hypertension, GERD, and asthma who presents to the ED complaining of dizziness.  Patient reports that over the past couple of days she has been feeling like the room is spinning around her whenever she goes to stand up or move her head.  She denies feeling lightheaded and has not had any chest pain or shortness of breath.  She has not had any vision changes, speech changes, numbness, or weakness.  She states she was seen at [facility] approximately 1 month ago for similar symptoms, was diagnosed with peripheral vertigo at that time and started on a medication for it.  She states she was taking the medication and it was helping, but she ran out a few days ago.        Past Medical History:  Diagnosis Date  . Arthritis   . Asthma   . Depression    when mother passed away  . Dysrhythmia   . GERD (gastroesophageal reflux disease)   . History of kidney stones   . Hypertension   . Pneumonia   . Pre-diabetes   . Sleep apnea    does not use CPAP    Patient Active Problem List   Diagnosis Date Noted  . Osteoarthritis of right knee 07/30/2016  . 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 ABDOMINAL HYSTERECTOMY    . BACK SURGERY     cervical and lumbar  . ROTATOR CUFF REPAIR     right  . TOTAL KNEE ARTHROPLASTY Right 07/30/2016   Procedure: RIGHT TOTAL KNEE ARTHROPLASTY;  Surgeon: Kathryne Hitch, MD;  Location: [facility] ORS;  Service: Orthopedics;  Laterality: Right;    Prior to Admission medications   Medication Sig Start Date End Date Taking?  Authorizing Provider  acetaminophen (TYLENOL) 500 MG tablet Take 500-1,000 mg by mouth every 6 (six) hours as needed (for pain.).    [provider]  famotidine (PEPCID) 20 MG tablet Take 1 tablet (20 mg total) by mouth 2 (two) times daily. 10/17/19   Sharman Cheek, MD  fluticasone [facility]) 50 MCG/ACT nasal spray Place 1-2 sprays into both nostrils daily as needed for allergies.    [provider]  guaiFENesin 200 MG tablet Take 2 tablets (400 mg total) by mouth every 6 (six) hours as needed for cough or to loosen phlegm. 04/24/19   Dionne Bucy, MD  hydrochlorothiazide (HYDRODIURIL) 25 MG tablet Take 25 mg by mouth daily. 07/02/16   [provider]  meclizine (ANTIVERT) 25 MG tablet Take 1 tablet (25 mg total) by mouth 3 (three) times daily as needed for dizziness. 09/16/20   Chesley Noon, MD  naproxen (NAPROSYN) 500 MG tablet Take 1 tablet (500 mg total) by mouth 2 (two) times daily with a meal. 10/17/19   Sharman Cheek, MD  ondansetron (ZOFRAN ODT) 4 MG disintegrating tablet Take 1 tablet (4 mg total) by mouth every 8 (eight) hours as needed for nausea or vomiting. 10/17/19   Sharman Cheek, MD  oxymetazoline (AFRIN) 0.05 % nasal spray Place 1 spray into both nostrils 2 (two) times daily as needed for  congestion.    [provider]  ranitidine (ZANTAC) 150 MG tablet Take 150 mg by mouth 2 (two) times daily. 07/02/16   [provider]  rivaroxaban (XARELTO) 10 MG TABS tablet Take 1 tablet (10 mg total) by mouth daily with breakfast. 08/02/16   Kathryne Hitch, MD  sertraline (ZOLOFT) 50 MG tablet Take 50 mg by mouth daily as needed. For mood enhancer. 07/18/16   [provider]  VENTOLIN HFA 108 (90 Base) MCG/ACT inhaler Inhale 1-2 puffs into the lungs every 6 (six) hours as needed for shortness of breath or wheezing. 07/18/16   [provider]    Allergies Latex  History reviewed. No pertinent family  history.  Social History Social History   Tobacco Use  . Smoking status: Never Smoker  . Smokeless tobacco: Never Used  Vaping Use  . Vaping Use: Never used  Substance Use Topics  . Alcohol use: No  . Drug use: No    Review of Systems  Constitutional: No fever/chills Eyes: No visual changes. ENT: No sore throat. Cardiovascular: Denies chest pain. Respiratory: Denies shortness of breath. Gastrointestinal: No abdominal pain.  No nausea, no vomiting.  No diarrhea.  No constipation. Genitourinary: Negative for dysuria. Musculoskeletal: Negative for back pain. Skin: Negative for rash. Neurological: Negative for headaches, focal weakness or numbness.  Positive for dizziness.  ____________________________________________   PHYSICAL EXAM:  VITAL SIGNS: ED Triage Vitals  Enc Vitals Group     BP 09/16/20 1302 (!) 113/59     Pulse Rate 09/16/20 1302 60     Resp 09/16/20 1302 (!) 22     Temp 09/16/20 1302 (!) 97.4 F (36.3 C)     Temp Source 09/16/20 1302 Oral     SpO2 09/16/20 1302 96 %     Weight 09/16/20 1259 295 lb (133.8 kg)     Height 09/16/20 1259 5\' 5"  (1.651 m)     Head Circumference --      Peak Flow --      Pain Score 09/16/20 1259 0     Pain Loc --      Pain Edu? --      Excl. in GC? --     Constitutional: Alert and oriented. Eyes: Conjunctivae are normal. Head: Atraumatic. Nose: No congestion/rhinnorhea. Mouth/Throat: Mucous membranes are moist. Neck: Normal ROM Cardiovascular: Normal rate, regular rhythm. Grossly normal heart sounds. Respiratory: Normal respiratory effort.  No retractions. Lungs CTAB. Gastrointestinal: Soft and nontender. No distention. Genitourinary: deferred Musculoskeletal: No lower extremity tenderness nor edema. Neurologic:  Normal speech and language. No gross focal neurologic deficits are appreciated. Skin:  Skin is warm, dry and intact. No rash noted. Psychiatric: Mood and affect are normal. Speech and behavior are  normal.  ____________________________________________   LABS (all labs ordered are listed, but only abnormal results are displayed)  Labs Reviewed  BASIC METABOLIC PANEL - Abnormal; Notable for the following components:      Result Value   Glucose, Bld 102 (*)    Creatinine, Ser 1.02 (*)    All other components within normal limits  CBC - Abnormal; Notable for the following components:   RBC 5.26 (*)    MCV 76.8 (*)    MCH 24.0 (*)    RDW 15.9 (*)    All other components within normal limits  URINALYSIS, COMPLETE (UACMP) WITH MICROSCOPIC   ____________________________________________  EKG  ED ECG REPORT I, 09/18/20, the attending physician, personally viewed and interpreted this ECG.   Date:  09/16/2020  EKG Time: 13:06  Rate: 58  Rhythm: sinus bradycardia  Axis: Normal  Intervals:first-degree A-V block   ST&T Change: None   PROCEDURES  Procedure(s) performed (including Critical Care):  Procedures   ____________________________________________   INITIAL IMPRESSION / ASSESSMENT AND PLAN / ED COURSE       69 year old female with past medical history of hypertension, GERD, and asthma who presents to the ED complaining of feeling like the room is spinning around her when she goes to stand up or moves her head.  She denies any dizziness at rest and does not have any focal neurologic deficits on exam.  Given association with position, I suspect patient has a peripheral vertigo and I doubt stroke or other source of central vertigo.  Lab work is unremarkable and I have reviewed CT results from [facility] ED visit previously, which were unremarkable.  Patient was given a dose of meclizine and states her symptoms are improved, she is requesting to be discharged home.  She is appropriate for discharge home with PCP follow-up, was counseled to return to the ED for new worsening symptoms.  Patient agrees with plan.      ____________________________________________   FINAL  CLINICAL IMPRESSION(S) / ED DIAGNOSES  Final diagnoses:  Peripheral vertigo, unspecified laterality     ED Discharge Orders         Ordered    meclizine (ANTIVERT) 25 MG tablet  3 times daily PRN        09/16/20 1747           Note:  This document was prepared using Dragon voice recognition software and may include unintentional dictation errors.   Chesley Noon, MD 09/16/20 1750
